# Patient Record
Sex: Female | Born: 2000 | Race: Black or African American | Hispanic: No | Marital: Single | Smoking: Never smoker
Health system: Southern US, Community
[De-identification: ages and names within clinical notes are randomized; demographics above are authoritative.]

## PROBLEM LIST (undated history)

## (undated) DIAGNOSIS — N390 Urinary tract infection, site not specified: Secondary | ICD-10-CM

## (undated) DIAGNOSIS — J45909 Unspecified asthma, uncomplicated: Secondary | ICD-10-CM

## (undated) DIAGNOSIS — F3181 Bipolar II disorder: Secondary | ICD-10-CM

## (undated) DIAGNOSIS — F32A Depression, unspecified: Secondary | ICD-10-CM

## (undated) DIAGNOSIS — F329 Major depressive disorder, single episode, unspecified: Secondary | ICD-10-CM

## (undated) DIAGNOSIS — R002 Palpitations: Secondary | ICD-10-CM

## (undated) HISTORY — PX: HERNIA REPAIR: SHX51

---
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
## 2018-02-26 ENCOUNTER — Emergency Department (HOSPITAL_COMMUNITY)
Admission: EM | Admit: 2018-02-26 | Discharge: 2018-02-26 | Disposition: A | Discharge status: Home / Self Care | Payer: Medicaid Other | Attending: Emergency Medicine | Admitting: Emergency Medicine

## 2018-02-26 ENCOUNTER — Emergency Department (HOSPITAL_COMMUNITY): Payer: Medicaid Other

## 2018-02-26 ENCOUNTER — Other Ambulatory Visit: Payer: Self-pay

## 2018-02-26 DIAGNOSIS — Z79899 Other long term (current) drug therapy: Secondary | ICD-10-CM | POA: Insufficient documentation

## 2018-02-26 DIAGNOSIS — R55 Syncope and collapse: Secondary | ICD-10-CM | POA: Diagnosis not present

## 2018-02-26 DIAGNOSIS — Y939 Activity, unspecified: Secondary | ICD-10-CM | POA: Insufficient documentation

## 2018-02-26 DIAGNOSIS — W1839XA Other fall on same level, initial encounter: Secondary | ICD-10-CM | POA: Diagnosis not present

## 2018-02-26 DIAGNOSIS — R0602 Shortness of breath: Secondary | ICD-10-CM | POA: Insufficient documentation

## 2018-02-26 DIAGNOSIS — R079 Chest pain, unspecified: Secondary | ICD-10-CM | POA: Insufficient documentation

## 2018-02-26 DIAGNOSIS — M542 Cervicalgia: Secondary | ICD-10-CM | POA: Insufficient documentation

## 2018-02-26 DIAGNOSIS — Y929 Unspecified place or not applicable: Secondary | ICD-10-CM | POA: Insufficient documentation

## 2018-02-26 DIAGNOSIS — Y999 Unspecified external cause status: Secondary | ICD-10-CM | POA: Insufficient documentation

## 2018-02-26 DIAGNOSIS — S0990XA Unspecified injury of head, initial encounter: Secondary | ICD-10-CM | POA: Diagnosis present

## 2018-02-26 DIAGNOSIS — Z3202 Encounter for pregnancy test, result negative: Secondary | ICD-10-CM | POA: Diagnosis not present

## 2018-02-26 LAB — RAPID URINE DRUG SCREEN, HOSP PERFORMED
Amphetamines: NOT DETECTED
BARBITURATES: NOT DETECTED
Benzodiazepines: NOT DETECTED
COCAINE: NOT DETECTED
Opiates: NOT DETECTED
TETRAHYDROCANNABINOL: POSITIVE — AB

## 2018-02-26 LAB — CBC WITH DIFFERENTIAL/PLATELET
Abs Immature Granulocytes: 0.01 10*3/uL (ref 0.00–0.07)
Basophils Absolute: 0 10*3/uL (ref 0.0–0.1)
Basophils Relative: 1 %
EOS ABS: 0.4 10*3/uL (ref 0.0–0.5)
EOS PCT: 7 %
HEMATOCRIT: 38.3 % (ref 36.0–46.0)
HEMOGLOBIN: 11.9 g/dL — AB (ref 12.0–15.0)
Immature Granulocytes: 0 %
LYMPHS ABS: 2.7 10*3/uL (ref 0.7–4.0)
Lymphocytes Relative: 47 %
MCH: 26.7 pg (ref 26.0–34.0)
MCHC: 31.1 g/dL (ref 30.0–36.0)
MCV: 86.1 fL (ref 80.0–100.0)
MONO ABS: 0.4 10*3/uL (ref 0.1–1.0)
Monocytes Relative: 7 %
Neutro Abs: 2.2 10*3/uL (ref 1.7–7.7)
Neutrophils Relative %: 38 %
Platelets: 267 10*3/uL (ref 150–400)
RBC: 4.45 MIL/uL (ref 3.87–5.11)
RDW: 13.9 % (ref 11.5–15.5)
WBC: 5.7 10*3/uL (ref 4.0–10.5)
nRBC: 0 % (ref 0.0–0.2)

## 2018-02-26 LAB — BASIC METABOLIC PANEL
Anion gap: 6 (ref 5–15)
BUN: 15 mg/dL (ref 6–20)
CHLORIDE: 109 mmol/L (ref 98–111)
CO2: 27 mmol/L (ref 22–32)
CREATININE: 0.84 mg/dL (ref 0.44–1.00)
Calcium: 9 mg/dL (ref 8.9–10.3)
GFR calc Af Amer: >60 mL/min (ref >60)
GFR calc non Af Amer: >60 mL/min (ref >60)
GLUCOSE: 96 mg/dL (ref 70–99)
Potassium: 3.5 mmol/L (ref 3.5–5.1)
Sodium: 142 mmol/L (ref 135–145)

## 2018-02-26 LAB — CBG MONITORING, ED: GLUCOSE-CAPILLARY: 85 mg/dL (ref 70–99)

## 2018-02-26 LAB — D-DIMER, QUANTITATIVE (NOT AT ARMC): D DIMER QUANT: 0.56 ug{FEU}/mL — AB (ref 0.00–0.50)

## 2018-02-26 LAB — POC URINE PREG, ED: Preg Test, Ur: NEGATIVE

## 2018-02-26 MED ORDER — KETOROLAC TROMETHAMINE 15 MG/ML IJ SOLN
15.0000 mg | Freq: Once | INTRAMUSCULAR | Status: AC
  Administered 2018-02-26: 15 mg via INTRAVENOUS
  Filled 2018-02-26: qty 1

## 2018-02-26 MED ORDER — SODIUM CHLORIDE 0.9 % IJ SOLN
INTRAMUSCULAR | Status: AC
  Filled 2018-02-26: qty 50

## 2018-02-26 MED ORDER — IOPAMIDOL (ISOVUE-370) INJECTION 76%
INTRAVENOUS | Status: AC
  Filled 2018-02-26: qty 100

## 2018-02-26 MED ORDER — IOPAMIDOL (ISOVUE-370) INJECTION 76%
100.0000 mL | Freq: Once | INTRAVENOUS | Status: AC | PRN
  Administered 2018-02-26: 55 mL via INTRAVENOUS

## 2018-02-26 MED ORDER — IBUPROFEN 800 MG PO TABS: 800.0000 mg | ORAL_TABLET | Freq: Three times a day (TID) | ORAL | 0 refills | Status: DC | PRN

## 2018-02-26 MED ORDER — OXYCODONE HCL 5 MG PO TABS
5.0000 mg | ORAL_TABLET | Freq: Once | ORAL | Status: AC
  Administered 2018-02-26: 5 mg via ORAL
  Filled 2018-02-26: qty 1

## 2018-02-26 NOTE — ED Triage Notes (Signed)
Patient arrives via EMS with syncopal episode at school. Patient states she thought she had an asthma attack which leads to anxiety then had a syncopal episode. +Neck pain, head pain, sensitivity to light. C-collar in place.   NSR  BP: 126/84 02: 100% Cbg: 94

## 2018-02-26 NOTE — ED Provider Notes (Signed)
[facility]-EMERGENCY DEPT Provider Note   CSN: [id number] Arrival date & time: 02/26/18  1043     History   Chief Complaint Chief Complaint  Patient presents with  . Loss of Consciousness  . Neck Pain    HPI Kara Byrd is a 17 y.o. female.  The history is provided by the patient and medical records. No language interpreter was used.  Loss of Consciousness   Associated symptoms include chest pain and headaches. Pertinent negatives include abdominal pain and palpitations.  Neck Pain   Associated symptoms include photophobia, chest pain, syncope and headaches.   Kara Byrd is a 17 y.o. female who presents to the Emergency Department for evaluation after witnessed syncopal episode. No witnesses present at bedside. Patient states that no one informed her timeframe of LOC. She did strike her head and complaining of headache and neck pain. She reports feeling as if she was going to have an anxiety attack just prior. She felt like her chest was tight and was having some shortness of breath. She is chest pain free currently and no longer experiencing shortness of breath either. She is on OCP's. No recent leg swelling or calf pain. No family hx of sudden cardiac death.    Past Medical History:  Diagnosis Date  . Depression     Patient Active Problem List   Diagnosis Date Noted  . Suicidal ideation 07/23/2017    Past Surgical History:  Procedure Laterality Date  . HERNIA REPAIR       OB History   None      Home Medications    Prior to Admission medications   Medication Sig Start Date End Date Taking? Authorizing Provider  acetaminophen (TYLENOL) 325 MG tablet Take 2 tablets (650 mg total) by mouth every 6 (six) hours as needed. 07/31/17  Yes Hedges, Tinnie Gens, PA-C  albuterol (PROVENTIL HFA;VENTOLIN HFA) 108 (90 Base) MCG/ACT inhaler Inhale 2 puffs into the lungs every 6 (six) hours as needed for shortness of breath. 12/20/15  Yes  [provider]  cetirizine (ZYRTEC) 10 MG tablet Take 10 mg by mouth daily as needed for allergies. 12/21/16 02/26/18 Yes [provider]  lamoTRIgine (LAMICTAL) 100 MG tablet Take 100 mg by mouth daily.   Yes [provider]  montelukast (SINGULAIR) 10 MG tablet Take 10 mg by mouth at bedtime as needed (allergies).   Yes [provider]  norgestimate-ethinyl estradiol (ORTHO-CYCLEN,SPRINTEC,PREVIFEM) 0.25-35 MG-MCG tablet Take 1 tablet by mouth every evening.    Yes [provider]  ibuprofen (ADVIL,MOTRIN) 800 MG tablet Take 1 tablet (800 mg total) by mouth every 8 (eight) hours as needed. 02/26/18   Ward, Chase Picket, PA-C    Family History No family history on file.  Social History Social History   Tobacco Use  . Smoking status: Never Smoker  . Smokeless tobacco: Never Used  Substance Use Topics  . Alcohol use: No    Frequency: Never  . Drug use: No     Allergies   Amoxapine and related; Amoxicillin; and Aspirin   Review of Systems Review of Systems  Eyes: Positive for photophobia. Negative for visual disturbance.  Respiratory: Positive for shortness of breath. Negative for cough.   Cardiovascular: Positive for chest pain and syncope. Negative for palpitations and leg swelling.  Gastrointestinal: Negative for abdominal pain.  Musculoskeletal: Positive for neck pain.  Neurological: Positive for headaches.     Physical Exam Updated Vital Signs BP 115/62   Pulse 74  Temp 98.4 F (36.9 C) (Oral)   Resp 17   Ht 5\' 3"  (1.6 m)   Wt 46.3 kg   LMP 02/19/2018   SpO2 100%   BMI 18.08 kg/m   Physical Exam  Constitutional: She is oriented to person, place, and time. She appears well-developed and well-nourished. No distress.  HENT:  Head: Normocephalic and atraumatic.  Neck:  C-collar in place. + midline tenderness.  Cardiovascular: Normal rate, regular rhythm and normal heart sounds.  No murmur  heard. Pulmonary/Chest: Effort normal and breath sounds normal. No respiratory distress.  Abdominal: Soft. She exhibits no distension. There is no tenderness.  Musculoskeletal:  No lower extremity swelling or calf tenderness. Diffuse tenderness to thoracic spine both paraspinally and midline. 5/5 muscle strength in upper and lower extremities bilaterally including strong and equal grip strength and dorsiflexion/plantar flexion  Neurological: She is alert and oriented to person, place, and time.  Alert, oriented, thought content appropriate, able to give a coherent history. Speech is clear and goal oriented, able to follow commands.  Cranial Nerves:  II:  Peripheral visual fields grossly normal, pupils equal, round, reactive to light III, IV, VI: EOM intact bilaterally, ptosis not present V,VII: smile symmetric, eyes kept closed tightly against resistance, facial light touch sensation equal VIII: hearing grossly normal IX, X: symmetric soft palate movement, uvula elevates symmetrically  XI: bilateral shoulder shrug symmetric and strong XII: midline tongue extension Sensory to light touch normal in all four extremities.  Normal finger-to-nose and rapid alternating movements; No drift.   Skin: Skin is warm and dry.  Nursing note and vitals reviewed.    ED Treatments / Results  Labs (all labs ordered are listed, but only abnormal results are displayed) Labs Reviewed  CBC WITH DIFFERENTIAL/PLATELET - Abnormal; Notable for the following components:      Result Value   Hemoglobin 11.9 (*)    All other components within normal limits  RAPID URINE DRUG SCREEN, HOSP PERFORMED - Abnormal; Notable for the following components:   Tetrahydrocannabinol POSITIVE (*)    All other components within normal limits  D-DIMER, QUANTITATIVE (NOT AT [facility]) - Abnormal; Notable for the following components:   D-Dimer, Quant 0.56 (*)    All other components within normal limits  BASIC METABOLIC PANEL  CBG  MONITORING, ED  POC URINE PREG, ED    EKG EKG Interpretation  Date/Time:  Wednesday February 26 2018 11:01:39 EDT Ventricular Rate:  82 PR Interval:    QRS Duration: 97 QT Interval:  338 QTC Calculation: 395 R Axis:   81 Text Interpretation:  Sinus rhythm Confirmed by Kennis Carina [phone]) on 02/26/2018 12:08:05 PM   Radiology Dg Thoracic Spine 2 View  Result Date: 02/26/2018 CLINICAL DATA:  17 year old female status post syncopal episode at school. Upper back pain. EXAM: THORACIC SPINE 2 VIEWS COMPARISON:  Cervical spine CT today reported separately. FINDINGS: Normal thoracic segmentation. Nearing skeletal maturity. Bone mineralization is within normal limits. Cervicothoracic junction alignment is within normal limits. Slightly less reversal of cervical lordosis than demonstrated earlier today. Normal thoracic vertebral height and alignment. Relatively preserved disc spaces. Visible posterior ribs, thoracic and upper abdominal visceral contours appear normal. IMPRESSION: Negative. Electronically Signed   By: Odessa Fleming M.D.   On: 02/26/2018 12:59   Ct Head Wo Contrast  Result Date: 02/26/2018 CLINICAL DATA:  Posttraumatic headache and neck pain after fall. EXAM: CT HEAD WITHOUT CONTRAST CT CERVICAL SPINE WITHOUT CONTRAST TECHNIQUE: Multidetector CT imaging of the head and cervical spine was  performed following the standard protocol without intravenous contrast. Multiplanar CT image reconstructions of the cervical spine were also generated. COMPARISON:  None. FINDINGS: CT HEAD FINDINGS Brain: No evidence of acute infarction, hemorrhage, hydrocephalus, extra-axial collection or mass lesion/mass effect. Vascular: No hyperdense vessel or unexpected calcification. Skull: Normal. Negative for fracture or focal lesion. Sinuses/Orbits: Mild right sphenoid sinusitis is noted. Other: None. CT CERVICAL SPINE FINDINGS Alignment: Normal. Skull base and vertebrae: No acute fracture. No primary bone lesion  or focal pathologic process. Soft tissues and spinal canal: No prevertebral fluid or swelling. No visible canal hematoma. Disc levels:  Normal. Upper chest: Negative. Other: None. IMPRESSION: Mild right sphenoid sinusitis. No acute intracranial abnormality seen. Normal cervical spine. Electronically Signed   By: Lupita Raider, M.D.   On: 02/26/2018 12:51   Ct Angio Chest Pe W And/or Wo Contrast  Result Date: 02/26/2018 CLINICAL DATA:  Syncopal episode at school. Concern for asthma attack. Evaluate for pulmonary embolism. EXAM: CT ANGIOGRAPHY CHEST WITH CONTRAST TECHNIQUE: Multidetector CT imaging of the chest was performed using the standard protocol during bolus administration of intravenous contrast. Multiplanar CT image reconstructions and MIPs were obtained to evaluate the vascular anatomy. CONTRAST:  55 mL ISOVUE-370 IOPAMIDOL (ISOVUE-370) INJECTION 76% COMPARISON:  Head and C-spine CT - earlier same day FINDINGS: Vascular Findings: There is adequate opacification of the pulmonary arterial system with the main pulmonary artery measuring 344 Hounsfield units. There are no discrete filling defects within the pulmonary arterial tree to suggest pulmonary embolism. Normal caliber of the main pulmonary artery. Normal heart size.  No pericardial effusion. No evidence of thoracic aortic aneurysm, dissection or periaortic stranding on this nongated examination. Suspected bovine configuration of the aortic arch, suboptimally evaluated due to streak artifact from contrast within the adjacent left subclavian vein. Review of the MIP images confirms the above findings. ---------------------------------------------------------------------------------- Nonvascular Findings: Mediastinum/Lymph Nodes: No bulky mediastinal, hilar axillary lymphadenopathy. Lungs/Pleura: No focal airspace opacities. No pleural effusion or pneumothorax. The central pulmonary airways appear widely patent. No discrete pulmonary nodules. Upper  abdomen: Limited early arterial phase evaluation of the upper abdomen is normal. Musculoskeletal: No acute or aggressive osseous abnormalities. Regional soft tissues appear normal. Normal appearance of the thyroid gland. IMPRESSION: No acute cardiopulmonary disease. Specifically, no evidence of pulmonary embolism. Electronically Signed   By: Simonne Come M.D.   On: 02/26/2018 14:05   Ct Cervical Spine Wo Contrast  Result Date: 02/26/2018 CLINICAL DATA:  Posttraumatic headache and neck pain after fall. EXAM: CT HEAD WITHOUT CONTRAST CT CERVICAL SPINE WITHOUT CONTRAST TECHNIQUE: Multidetector CT imaging of the head and cervical spine was performed following the standard protocol without intravenous contrast. Multiplanar CT image reconstructions of the cervical spine were also generated. COMPARISON:  None. FINDINGS: CT HEAD FINDINGS Brain: No evidence of acute infarction, hemorrhage, hydrocephalus, extra-axial collection or mass lesion/mass effect. Vascular: No hyperdense vessel or unexpected calcification. Skull: Normal. Negative for fracture or focal lesion. Sinuses/Orbits: Mild right sphenoid sinusitis is noted. Other: None. CT CERVICAL SPINE FINDINGS Alignment: Normal. Skull base and vertebrae: No acute fracture. No primary bone lesion or focal pathologic process. Soft tissues and spinal canal: No prevertebral fluid or swelling. No visible canal hematoma. Disc levels:  Normal. Upper chest: Negative. Other: None. IMPRESSION: Mild right sphenoid sinusitis. No acute intracranial abnormality seen. Normal cervical spine. Electronically Signed   By: Lupita Raider, M.D.   On: 02/26/2018 12:51    Procedures Procedures (including critical care time)  Medications Ordered in ED Medications  sodium chloride 0.9 % injection (has no administration in time range)  iopamidol (ISOVUE-370) 76 % injection (has no administration in time range)  oxyCODONE (Oxy IR/ROXICODONE) immediate release tablet 5 mg (5 mg Oral  Given 02/26/18 1201)  iopamidol (ISOVUE-370) 76 % injection 100 mL (55 mLs Intravenous Contrast Given 02/26/18 1340)  ketorolac (TORADOL) 15 MG/ML injection 15 mg (15 mg Intravenous Given 02/26/18 1426)     Initial Impression / Assessment and Plan / ED Course  I have reviewed the triage vital signs and the nursing notes.  Pertinent labs & imaging results that were available during my care of the patient were reviewed by me and considered in my medical decision making (see chart for details).    Kara Byrd is a 17 y.o. female who presents to ED for syncopal episode just prior to arrival. Does have hx of similar. Did have chest pain just prior similar to her typical anxiety attacks. EKG NSR. Given chest pain with syncope, d-dimer was obtained. This was slightly elevated, therefore proceeded to CT angio which was negative. Upreg negative. Labs reassuring. Did strike her head and complaining of headache / neck pain. CT head and c-spine negative. Patient observed in ED. Tolerating PO. Ambulating in ED without difficulty. Evaluation does not show pathology that would require ongoing emergent intervention or inpatient treatment. Encouraged to follow up with PCP. Return precautions discussed. All questions answered.   Patient seen by and discussed with Dr. Pilar Plate who agrees with treatment plan.    Final Clinical Impressions(s) / ED Diagnoses   Final diagnoses:  Syncope and collapse  Injury of head, initial encounter    ED Discharge Orders         Ordered    ibuprofen (ADVIL,MOTRIN) 800 MG tablet  Every 8 hours PRN     02/26/18 1436           Ward, Chase Picket, PA-C 02/26/18 1514    Sabas Sous, MD 02/26/18 1600

## 2018-02-26 NOTE — Discharge Instructions (Addendum)
It was my pleasure taking care of you today!   Ibuprofen as needed for pain. Limiting screen time (tv's, phone, tablet, etc.) and staying in a darker room can help with headache as well.   If your symptoms are not improving in 1 week, please see your campus health clinic or schedule a follow up appointment with the clinic listed.   Return to ER for new or worsening symptoms, any additional concerns.

## 2018-02-26 NOTE — ED Notes (Signed)
Patient verbalized understanding of discharge instructions, no questions. Patient ambulated out of ED with steady gait in no distress.  

## 2018-02-28 ENCOUNTER — Other Ambulatory Visit: Payer: Self-pay

## 2018-02-28 ENCOUNTER — Encounter (HOSPITAL_COMMUNITY): Payer: Self-pay | Admitting: *Deleted

## 2018-02-28 ENCOUNTER — Emergency Department (HOSPITAL_COMMUNITY)
Admission: EM | Admit: 2018-02-28 | Discharge: 2018-02-28 | Disposition: A | Discharge status: Home / Self Care | Payer: Medicaid Other | Attending: Emergency Medicine | Admitting: Emergency Medicine

## 2018-02-28 DIAGNOSIS — F329 Major depressive disorder, single episode, unspecified: Secondary | ICD-10-CM | POA: Insufficient documentation

## 2018-02-28 DIAGNOSIS — Z79899 Other long term (current) drug therapy: Secondary | ICD-10-CM | POA: Insufficient documentation

## 2018-02-28 DIAGNOSIS — N939 Abnormal uterine and vaginal bleeding, unspecified: Secondary | ICD-10-CM | POA: Diagnosis present

## 2018-02-28 LAB — I-STAT BETA HCG BLOOD, ED (MC, WL, AP ONLY): I-stat hCG, quantitative: <5 m[IU]/mL (ref <5)

## 2018-02-28 LAB — URINALYSIS, ROUTINE W REFLEX MICROSCOPIC
Bilirubin Urine: NEGATIVE
Glucose, UA: NEGATIVE mg/dL
Hgb urine dipstick: NEGATIVE
KETONES UR: NEGATIVE mg/dL
LEUKOCYTES UA: NEGATIVE
NITRITE: NEGATIVE
PH: 8 (ref 5.0–8.0)
PROTEIN: NEGATIVE mg/dL
Specific Gravity, Urine: 1.021 (ref 1.005–1.030)

## 2018-02-28 LAB — CBC
HEMATOCRIT: 37.8 % (ref 36.0–46.0)
Hemoglobin: 11.6 g/dL — ABNORMAL LOW (ref 12.0–15.0)
MCH: 26.8 pg (ref 26.0–34.0)
MCHC: 30.7 g/dL (ref 30.0–36.0)
MCV: 87.3 fL (ref 80.0–100.0)
NRBC: 0 % (ref 0.0–0.2)
PLATELETS: 258 10*3/uL (ref 150–400)
RBC: 4.33 MIL/uL (ref 3.87–5.11)
RDW: 14 % (ref 11.5–15.5)
WBC: 5.1 10*3/uL (ref 4.0–10.5)

## 2018-02-28 LAB — WET PREP, GENITAL
Clue Cells Wet Prep HPF POC: NONE SEEN
SPERM: NONE SEEN
Trich, Wet Prep: NONE SEEN
YEAST WET PREP: NONE SEEN

## 2018-02-28 NOTE — ED Provider Notes (Signed)
[facility]-EMERGENCY DEPT Provider Note  CSN: [id number] Arrival date & time: 02/28/18  1949    History   Chief Complaint Chief Complaint  Patient presents with  . Vaginal Bleeding    HPI Kara Byrd is a 17 y.o. female with no significant medical historywho presented to the ED for   Vaginal Bleeding  Primary symptoms include vaginal bleeding.  Primary symptoms include no discharge, no pelvic pain, no dyspareunia, no genital lesions, no genital pain, no genital rash, no genital itching, no genital odor, no dysuria. There has been no fever. Episode onset: 2 months ago. Episode frequency: Most days. She occassionally has 2-3 days without bleeding. The problem has not changed since onset.She is not pregnant. She has not missed her period. LMP: 02/19/18. The patient's menstrual history has been regular. The discharge was bloody. Associated symptoms include abdominal pain (occasional cramping) and nausea. Pertinent negatives include no anorexia, no diaphoresis, no constipation, no diarrhea, no vomiting, no light-headedness and no dizziness. She has tried nothing for the symptoms. The treatment provided no relief. Sexual activity: sexually active. There is no concern regarding sexually transmitted diseases. She uses oral contraceptives for contraception. Associated medical issues do not include STD, PID, endometriosis, gynecological surgery or ectopic pregnancy.    Past Medical History:  Diagnosis Date  . Depression     Patient Active Problem List   Diagnosis Date Noted  . Suicidal ideation 07/23/2017    Past Surgical History:  Procedure Laterality Date  . HERNIA REPAIR       OB History   None      Home Medications    Prior to Admission medications   Medication Sig Start Date End Date Taking? Authorizing Provider  albuterol (PROVENTIL HFA;VENTOLIN HFA) 108 (90 Base) MCG/ACT inhaler Inhale 2 puffs into the lungs every 6 (six) hours as needed for  shortness of breath. 12/20/15  Yes [provider]  ibuprofen (ADVIL,MOTRIN) 800 MG tablet Take 1 tablet (800 mg total) by mouth every 8 (eight) hours as needed. Patient taking differently: Take 800 mg by mouth every 8 (eight) hours as needed for moderate pain.  02/26/18  Yes Ward, Chase Picket, PA-C  lamoTRIgine (LAMICTAL) 100 MG tablet Take 100 mg by mouth every evening.    Yes [provider]  norgestimate-ethinyl estradiol (ORTHO-CYCLEN,SPRINTEC,PREVIFEM) 0.25-35 MG-MCG tablet Take 1 tablet by mouth every evening.    Yes [provider]  acetaminophen (TYLENOL) 325 MG tablet Take 2 tablets (650 mg total) by mouth every 6 (six) hours as needed. Patient not taking: Reported on 02/28/2018 07/31/17   Eyvonne Mechanic, PA-C    Family History No family history on file.  Social History Social History   Tobacco Use  . Smoking status: Never Smoker  . Smokeless tobacco: Never Used  Substance Use Topics  . Alcohol use: No    Frequency: Never  . Drug use: No     Allergies   Amoxapine and related; Amoxicillin; and Aspirin   Review of Systems Review of Systems  Constitutional: Negative for diaphoresis.  Gastrointestinal: Positive for abdominal pain (occasional cramping) and nausea. Negative for anorexia, constipation, diarrhea and vomiting.  Genitourinary: Positive for vaginal bleeding. Negative for difficulty urinating, dyspareunia, dysuria, pelvic pain, urgency, vaginal discharge and vaginal pain.  Musculoskeletal: Negative.   Skin: Negative.   Neurological: Negative for dizziness and light-headedness.  Hematological: Does not bruise/bleed easily.   Physical Exam Updated Vital Signs BP 109/72 (BP Location: Left Arm)   Pulse 78   Temp  98.4 F (36.9 C) (Oral)   Resp 18   LMP 02/19/2018   SpO2 100%   Physical Exam  Constitutional: Vital signs are normal. She appears well-developed and well-nourished. She is cooperative. No distress.  Cardiovascular:  Normal rate, regular rhythm and normal heart sounds.  Pulmonary/Chest: Effort normal and breath sounds normal.  Abdominal: Soft. Normal appearance and bowel sounds are normal. There is no tenderness.  Genitourinary: Uterus normal. Cervix exhibits no motion tenderness, no discharge and no friability. There is bleeding in the vagina. No vaginal discharge found.  Genitourinary Comments: Cervical os closed. Dark red blood seen in vagina.  Musculoskeletal: Normal range of motion.  Lymphadenopathy: No inguinal adenopathy noted on the right or left side.  Neurological: She is alert.  Nursing note and vitals reviewed.  ED Treatments / Results  Labs (all labs ordered are listed, but only abnormal results are displayed) Labs Reviewed  WET PREP, GENITAL - Abnormal; Notable for the following components:      Result Value   WBC, Wet Prep HPF POC FEW (*)    All other components within normal limits  CBC - Abnormal; Notable for the following components:   Hemoglobin 11.6 (*)    All other components within normal limits  URINALYSIS, ROUTINE W REFLEX MICROSCOPIC - Abnormal; Notable for the following components:   APPearance HAZY (*)    All other components within normal limits  I-STAT BETA HCG BLOOD, ED ([facility], [facility], [facility] ONLY)  GC/CHLAMYDIA PROBE AMP ([place]) NOT AT [facility]    EKG None  Radiology No results found.  Procedures Procedures (including critical care time)  Medications Ordered in ED Medications - No data to display   Initial Impression / Assessment and Plan / ED Course  Triage vital signs and the nursing notes have been reviewed.  Pertinent labs & imaging results that were available during care of the patient were reviewed and considered in medical decision making (see chart for details).  Patient presents to the ED afebrile with complaints of vaginal bleeding x2 months. She has no other significant accompanying symptoms. On pelvic exam, vaginal bleeding is present, but there was  no abnormal findings such as palpable masses on bimanual exam. Given family history significant for menorrhagia and pt's demographics, it is possible that fibroids is a viable etiology. She is afebrile, well appearing and does not have any s/s to suggest an infectious etiology that would warrant further evaluation today. Will order hCG to evaluate for possible ectopic and CBC to look for anemia that warrants intervention.  Clinical Course as of Feb 29 2248  Fri Feb 28, 2018  2227 Negative urine hCG which assists in ruling out ectopic pregnancy. UA and wet prep unremarkable. Hgb consistent with baseline.   [GM]    Clinical Course User Index [GM] Mortis, Sharyon Medicus, PA-C    Final Clinical Impressions(s) / ED Diagnoses  1. Vaginal Bleeding. Advised to continue birth control as prescribed. Referral to gyn for follow-up.  Dispo: Home. After thorough clinical evaluation, this patient is determined to be medically stable and can be safely discharged with the previously mentioned treatment and/or outpatient follow-up/referral(s). At this time, there are no other apparent medical conditions that require further screening, evaluation or treatment.   Final diagnoses:  Vaginal bleeding    ED Discharge Orders    None        Reva Bores 02/28/18 2253    Tegeler, Canary Brim, MD 03/01/18 0002

## 2018-02-28 NOTE — ED Triage Notes (Signed)
Pt says for 2-3 months she has been having heavy vaginal bleeding. She usually has a 2-3 day time where she does not bleed and then it returns heavy with clotting. She is on oral contraceptives.

## 2018-02-28 NOTE — Discharge Instructions (Signed)
Your lab work looked good today.   I have placed contact information for a gynecology office for you to call and follow-up with. Please call them on Monday to schedule an appointment.  Thank you for allowing [place] to take care of you today.

## 2018-03-03 LAB — GC/CHLAMYDIA PROBE AMP (~~LOC~~) NOT AT ARMC
Chlamydia: POSITIVE — AB
NEISSERIA GONORRHEA: NEGATIVE

## 2018-03-07 ENCOUNTER — Telehealth: Payer: Self-pay | Admitting: Student

## 2018-03-07 DIAGNOSIS — A749 Chlamydial infection, unspecified: Secondary | ICD-10-CM

## 2018-03-07 MED ORDER — AZITHROMYCIN 500 MG PO TABS: 1000.0000 mg | ORAL_TABLET | Freq: Once | ORAL | 0 refills | Status: AC

## 2018-03-07 NOTE — Telephone Encounter (Addendum)
Kara Byrd tested positive for  Chlamydia. Patient was called by RN and allergies and pharmacy confirmed. Rx sent to pharmacy of choice.   Judeth Horn, NP 03/07/2018 3:39 PM       ----- Message from Kathe Becton, RN sent at 03/07/2018  2:21 PM EDT ----- This patient tested positive for :  chlamydia  She is allergic to:" Amoxapine, Amoxicillin, Aspirin."   I have informed the patient of her results and confirmed her pharmacy is correct in her chart. Please send Rx.   Thank you,   Kathe Becton, RN   Results faxed to [facility] Department.

## 2018-03-12 ENCOUNTER — Emergency Department (HOSPITAL_COMMUNITY): Payer: Medicaid Other

## 2018-03-12 ENCOUNTER — Encounter (HOSPITAL_COMMUNITY): Payer: Self-pay | Admitting: *Deleted

## 2018-03-12 ENCOUNTER — Emergency Department (HOSPITAL_COMMUNITY)
Admission: EM | Admit: 2018-03-12 | Discharge: 2018-03-12 | Disposition: A | Discharge status: Home / Self Care | Payer: Medicaid Other | Attending: Emergency Medicine | Admitting: Emergency Medicine

## 2018-03-12 DIAGNOSIS — R55 Syncope and collapse: Secondary | ICD-10-CM

## 2018-03-12 DIAGNOSIS — Z79899 Other long term (current) drug therapy: Secondary | ICD-10-CM | POA: Insufficient documentation

## 2018-03-12 DIAGNOSIS — J069 Acute upper respiratory infection, unspecified: Secondary | ICD-10-CM | POA: Diagnosis not present

## 2018-03-12 LAB — I-STAT CG4 LACTIC ACID, ED: Lactic Acid, Venous: 1.72 mmol/L (ref 0.5–1.9)

## 2018-03-12 LAB — URINALYSIS, ROUTINE W REFLEX MICROSCOPIC
Bilirubin Urine: NEGATIVE
GLUCOSE, UA: NEGATIVE mg/dL
HGB URINE DIPSTICK: NEGATIVE
Ketones, ur: NEGATIVE mg/dL
NITRITE: NEGATIVE
PH: 8 (ref 5.0–8.0)
PROTEIN: NEGATIVE mg/dL
SPECIFIC GRAVITY, URINE: 1.006 (ref 1.005–1.030)

## 2018-03-12 LAB — BASIC METABOLIC PANEL
Anion gap: 4 — ABNORMAL LOW (ref 5–15)
BUN: 6 mg/dL (ref 6–20)
CALCIUM: 8.6 mg/dL — AB (ref 8.9–10.3)
CO2: 26 mmol/L (ref 22–32)
Chloride: 109 mmol/L (ref 98–111)
Creatinine, Ser: 0.72 mg/dL (ref 0.44–1.00)
GFR calc Af Amer: >60 mL/min (ref >60)
GLUCOSE: 94 mg/dL (ref 70–99)
Potassium: 3.7 mmol/L (ref 3.5–5.1)
SODIUM: 139 mmol/L (ref 135–145)

## 2018-03-12 LAB — CBC
HCT: 36 % (ref 36.0–46.0)
Hemoglobin: 11.2 g/dL — ABNORMAL LOW (ref 12.0–15.0)
MCH: 26.6 pg (ref 26.0–34.0)
MCHC: 31.1 g/dL (ref 30.0–36.0)
MCV: 85.5 fL (ref 80.0–100.0)
PLATELETS: 244 10*3/uL (ref 150–400)
RBC: 4.21 MIL/uL (ref 3.87–5.11)
RDW: 13.4 % (ref 11.5–15.5)
WBC: 5.3 10*3/uL (ref 4.0–10.5)
nRBC: 0 % (ref 0.0–0.2)

## 2018-03-12 LAB — CBG MONITORING, ED: Glucose-Capillary: 70 mg/dL (ref 70–99)

## 2018-03-12 LAB — GROUP A STREP BY PCR: GROUP A STREP BY PCR: NOT DETECTED

## 2018-03-12 MED ORDER — SODIUM CHLORIDE 0.9 % IV BOLUS
1000.0000 mL | Freq: Once | INTRAVENOUS | Status: AC
  Administered 2018-03-12: 1000 mL via INTRAVENOUS

## 2018-03-12 NOTE — ED Provider Notes (Signed)
[facility] EMERGENCY DEPARTMENT Provider Note   CSN: [id number] Arrival date & time: 03/12/18  1333     History   Chief Complaint Chief Complaint  Patient presents with  . Near Syncope    HPI Kara Byrd is a 17 y.o. female.  The history is provided by the patient and medical records. No language interpreter was used.   Kara Byrd is a 17 y.o. female who presents to the Emergency Department complaining of syncopal episode just prior to arrival. Patient states that she was at school when she felt like she was going to pass out. She asked a classmate to help her get on the floor to lay against the wall. She was worried about sitting in the desk chair as they are spinning and she thought she may fall off. Classmate started helping her to the ground when she had a syncopal episode.  She states that her classmate told her that she passed out for a few seconds.  She believes she had full syncopal episode as she passed out as she kept "coming and going" in and out of consciousness.  She denies any head injury or other injury from the fall.  Denies any shaking or seizure-like activity.  No bowel or bladder incontinence.  Associated with dizziness which she describes as feeling like she spun around really quickly and you stop but still feel dizzy.  She has been experiencing URI symptoms since yesterday morning including cough, congestion and sore throat.  Denies chest pain or shortness of breath.  No fever or chills.  No abdominal pain, nausea or vomiting.  She was seen in the emergency department for similar episode on 10/16 where she did hit her head.  She had a CT of her head done at that time which was unremarkable.  She also had CT angios to assess for PE as she was having chest pain at that time.   Past Medical History:  Diagnosis Date  . Depression     Patient Active Problem List   Diagnosis Date Noted  . Suicidal ideation 07/23/2017    Past  Surgical History:  Procedure Laterality Date  . HERNIA REPAIR       OB History   None      Home Medications    Prior to Admission medications   Medication Sig Start Date End Date Taking? Authorizing Provider  albuterol (PROVENTIL HFA;VENTOLIN HFA) 108 (90 Base) MCG/ACT inhaler Inhale 2 puffs into the lungs every 6 (six) hours as needed for shortness of breath. 12/20/15  Yes [provider]  cetirizine (ZYRTEC) 10 MG tablet Take 10 mg by mouth as needed for allergies.   Yes [provider]  ibuprofen (ADVIL,MOTRIN) 800 MG tablet Take 1 tablet (800 mg total) by mouth every 8 (eight) hours as needed. Patient taking differently: Take 800 mg by mouth every 6 (six) hours as needed for moderate pain.  02/26/18  Yes Nakeya Adinolfi, Chase Picket, PA-C  lamoTRIgine (LAMICTAL) 100 MG tablet Take 100 mg by mouth every evening.    Yes [provider]  montelukast (SINGULAIR) 10 MG tablet Take 10 mg by mouth as needed.   Yes [provider]  norgestimate-ethinyl estradiol (ORTHO-CYCLEN,SPRINTEC,PREVIFEM) 0.25-35 MG-MCG tablet Take 1 tablet by mouth every evening.    Yes [provider]  acetaminophen (TYLENOL) 325 MG tablet Take 2 tablets (650 mg total) by mouth every 6 (six) hours as needed. Patient not taking: Reported on 02/28/2018 07/31/17   Eyvonne Mechanic, PA-C  Family History History reviewed. No pertinent family history.  Social History Social History   Tobacco Use  . Smoking status: Never Smoker  . Smokeless tobacco: Never Used  Substance Use Topics  . Alcohol use: No    Frequency: Never  . Drug use: No     Allergies   Amoxapine and related; Amoxicillin; and Aspirin   Review of Systems Review of Systems  Constitutional: Negative for fever.  HENT: Positive for congestion and sore throat.   Respiratory: Positive for cough. Negative for shortness of breath.   Cardiovascular: Negative for chest pain.  Neurological: Positive for syncope.    All other systems reviewed and are negative.    Physical Exam Updated Vital Signs BP 119/83 (BP Location: Right Arm)   Pulse 76   Temp 98 F (36.7 C) (Oral)   Resp 20   LMP 02/19/2018   SpO2 100%   Physical Exam  Constitutional: She is oriented to person, place, and time. She appears well-developed and well-nourished. No distress.  HENT:  Head: Normocephalic and atraumatic.  OP with mild erythema. No tonsillar exudates or hypertrophy.   Cardiovascular: Normal rate, regular rhythm and normal heart sounds.  No murmur heard. Pulmonary/Chest: Effort normal and breath sounds normal. No respiratory distress.  Lungs clear to auscultation bilaterally.  Abdominal: Soft. She exhibits no distension. There is no tenderness.  Musculoskeletal: She exhibits no edema.  Neurological: She is alert and oriented to person, place, and time.  Alert, oriented, thought content appropriate, able to give a coherent history. Speech is clear and goal oriented, able to follow commands.  Cranial Nerves:  II:  Peripheral visual fields grossly normal, pupils equal, round, reactive to light III, IV, VI: EOM intact bilaterally, ptosis not present V,VII: smile symmetric, eyes kept closed tightly against resistance, facial light touch sensation equal VIII: hearing grossly normal IX, X: symmetric soft palate movement, uvula elevates symmetrically  XI: bilateral shoulder shrug symmetric and strong XII: midline tongue extension 5/5 muscle strength in upper and lower extremities bilaterally including strong and equal grip strength and dorsiflexion/plantar flexion Sensory to light touch normal in all four extremities.  Normal finger-to-nose and rapid alternating movements;  normal gait and balance.  Skin: Skin is warm and dry.  Nursing note and vitals reviewed.    ED Treatments / Results  Labs (all labs ordered are listed, but only abnormal results are displayed) Labs Reviewed  BASIC METABOLIC PANEL -  Abnormal; Notable for the following components:      Result Value   Calcium 8.6 (*)    Anion gap 4 (*)    All other components within normal limits  CBC - Abnormal; Notable for the following components:   Hemoglobin 11.2 (*)    All other components within normal limits  URINALYSIS, ROUTINE W REFLEX MICROSCOPIC - Abnormal; Notable for the following components:   APPearance TURBID (*)    Leukocytes, UA SMALL (*)    Bacteria, UA MANY (*)    All other components within normal limits  GROUP A STREP BY PCR  CBG MONITORING, ED  I-STAT BETA HCG BLOOD, ED ([facility], [facility], [facility] ONLY)  I-STAT CG4 LACTIC ACID, ED    EKG EKG Interpretation  Date/Time:  Wednesday March 12 2018 13:39:37 EDT Ventricular Rate:  85 PR Interval:  152 QRS Duration: 66 QT Interval:  346 QTC Calculation: 411 R Axis:   87 Text Interpretation:  Normal sinus rhythm Normal ECG When compared to prior, no significant changes seen.  No STEMI Confirmed  by Theda Belfast ([id number]) on 03/12/2018 3:25:50 PM   Radiology Dg Chest 2 View  Result Date: 03/12/2018 CLINICAL DATA:  17 year old female with syncope today at school. Shortness of breath with chest pain and productive cough. EXAM: CHEST - 2 VIEW COMPARISON:  CTA chest and thoracic radiographs 02/26/2018 FINDINGS: Upright AP and lateral views. Lung volumes and mediastinal contours are normal. Visualized tracheal air column is within normal limits. Both lungs appear clear. No pneumothorax or pleural effusion. No osseous abnormality identified. Negative visible bowel gas pattern. IMPRESSION: Negative. No cardiopulmonary abnormality. Electronically Signed   By: Odessa Fleming M.D.   On: 03/12/2018 14:52    Procedures Procedures (including critical care time)  Medications Ordered in ED Medications  sodium chloride 0.9 % bolus 1,000 mL (1,000 mLs Intravenous New Bag/Given 03/12/18 1559)     Initial Impression / Assessment and Plan / ED Course  I have reviewed the triage vital signs  and the nursing notes.  Pertinent labs & imaging results that were available during my care of the patient were reviewed by me and considered in my medical decision making (see chart for details).     Kara Byrd is a 17 y.o. female who presents to ED for evaluation after syncopal episode just prior to arrival.  Seen on 10/16 by me for same.  At that time, she did undergo a CT scan of her head as well as CT Angio to r/o PE. Both of these showed no acute abnormalities. EKG at prior visit and today reassuring. Labs and orthostatics fine as well. Evaluation does not show pathology that would require ongoing emergent intervention or inpatient treatment. Given recurrent syncope, will refer to cardiology for Holter monitoring. Reasons to return to ER discussed and all questions answered.   Patient discussed with Dr. Rush Landmark who agrees with treatment plan.     Final Clinical Impressions(s) / ED Diagnoses   Final diagnoses:  Viral URI  Syncope and collapse    ED Discharge Orders    None       Kara Byrd, Chase Picket, PA-C 03/12/18 1725    Tegeler, Canary Brim, MD 03/13/18 [id number]

## 2018-03-12 NOTE — ED Triage Notes (Addendum)
Pt in via EMS to triage from school after syncopal episode in class, was feeling dizzy, assisted to the ground by a classmate, alert and oriented, feeling weak on arrival with a headache, pt states she has not been feeling well for the last few days, took some niquil last night which is not normal for her, had another syncopal episode a few weeks ago and was seen at that time, IV in place on arrival, pt reports she was told she had a concussion after last syncopal episode

## 2018-03-12 NOTE — ED Provider Notes (Signed)
Patient placed in Quick Look pathway, seen and evaluated   Chief Complaint: Syncope  HPI:   Pt passed out.  She was caught by a friend and helped to floor, no injury.  Pt reports recent  cough  ROS: cough no fever   Physical Exam:   Gen: No distress  Neuro: Awake and Alert  Skin: Warm    Focused Exam: Normal resp. Normal heart rate  Chest xray ordered  Initiation of care has begun. The patient has been counseled on the process, plan, and necessity for staying for the completion/evaluation, and the remainder of the medical screening examination   Osie Cheeks 03/12/18 1352    Gerhard Munch, MD 03/12/18 1517

## 2018-03-12 NOTE — Discharge Instructions (Signed)
It was my pleasure taking care of you today!   Please call the cardiology clinic listed first thing in the morning to schedule a follow-up appointment.  Drink plenty of fluids to stay well-hydrated. ` Return to the ER for new or worsening symptoms, any additional concerns.

## 2018-04-03 ENCOUNTER — Encounter: Payer: Self-pay | Admitting: Internal Medicine

## 2018-04-03 ENCOUNTER — Ambulatory Visit (INDEPENDENT_AMBULATORY_CARE_PROVIDER_SITE_OTHER): Payer: Medicaid Other | Admitting: Internal Medicine

## 2018-04-03 VITALS — BP 106/64 | HR 95 | Ht 63.0 in | Wt 108.2 lb

## 2018-04-03 DIAGNOSIS — R002 Palpitations: Secondary | ICD-10-CM

## 2018-04-03 DIAGNOSIS — R55 Syncope and collapse: Secondary | ICD-10-CM | POA: Diagnosis not present

## 2018-04-03 NOTE — Progress Notes (Signed)
Cardiology Office Note:    Date:  04/03/2018   ID:  Kara Byrd, DOB 07/02/2000, MRN [id number]  PCP:  System, Pcp Not In  Cardiologist:  No primary care provider on file.  Electrophysiologist:  None   Referring MD: Tegeler, Canary Brim,*   Syncope  History of Present Illness:    Kara Byrd is a 17 y.o. female with a hx of depression, who presents today for evaluation of a syncopal episode and palpitations.  She has had 4-5 episodes of lifetime syncope, the last 2 occurring in August and October 2019.  2 nights ago she had an unremarkable day, and at night when she was putting her clothes away she had a sensation of heart racing and presyncope.  She had to lie down on the floor to resolve her discomfort.  This is a similar sensation to what she experienced prior to her syncopal episode in October for which she presented to the emergency department.  She has accompanying chest discomfort and dizziness and lightheadedness.  While she is walking around or exercising she will have a sense of shortness of breath and chest discomfort.  She has asthma and takes Singulair and albuterol.  Occasionally she will take albuterol when she has a sensation of chest tightness and shortness of breath, but it does not always relieve her symptoms.  She has no other chronic medical illnesses.  She exercises at the gym and also walks around campus.  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  She will notice sometimes while she is sitting in class she will get a sensation of discomfort in her chest with palpitations and a rapid heartbeat.  She will have dizziness and some presyncope.  She denies any known history of heart disease and no known major illnesses in childhood.  She believes her mother had hypertension during her pregnancy with her.  The pregnancy was otherwise uncomplicated.  She has a  family history of a great uncle who died as an infant, and her paternal great grandmother who had sudden death.  Her maternal great-grandmother had an MI in her 51s.  There is no known family history of hypertrophic cardiomyopathy or arrhythmia.  She finds that her symptoms are primarily exacerbated by stress and anxiety, but she can have symptoms while she is having an ordinary or pleasant day.  She denies PND, orthopnea, or leg swelling.  She does snore.  She denies smoking, and denies alcohol use.  She denies recreational substance use, diet supplements, or herbal supplements.  Of note her urine drug screen performed in mid October was positive for THC.  She takes Lamictal for mood disorder, and she feels that this causes her to not sleep.  She is working on this with her psychiatry providers.  Past Medical History:  Diagnosis Date  . Depression     Past Surgical History:  Procedure Laterality Date  . HERNIA REPAIR      Current Medications: Current Meds  Medication Sig  . acetaminophen (TYLENOL) 325 MG tablet Take 2 tablets (650 mg total) by mouth every 6 (six) hours as needed.  Marland Kitchen albuterol (PROVENTIL HFA;VENTOLIN HFA) 108 (90 Base) MCG/ACT inhaler Inhale 2 puffs into the lungs every 6 (six) hours as needed for shortness of breath.  . cetirizine (ZYRTEC) 10 MG tablet Take 10 mg by mouth as needed for allergies.  Marland Kitchen ibuprofen (ADVIL,MOTRIN) 800 MG tablet Take 1 tablet (800  mg total) by mouth every 8 (eight) hours as needed. (Patient taking differently: Take 800 mg by mouth every 6 (six) hours as needed for moderate pain. )  . lamoTRIgine (LAMICTAL) 100 MG tablet Take 100 mg by mouth every evening.   . montelukast (SINGULAIR) 10 MG tablet Take 10 mg by mouth as needed.  . norgestimate-ethinyl estradiol (ORTHO-CYCLEN,SPRINTEC,PREVIFEM) 0.25-35 MG-MCG tablet Take 1 tablet by mouth every evening.      Allergies:   Amoxapine and related; Amoxicillin; and Aspirin   Social History    Socioeconomic History  . Marital status: Single    Spouse name: Not on file  . Number of children: Not on file  . Years of education: Not on file  . Highest education level: Not on file  Occupational History  . Not on file  Social Needs  . Financial resource strain: Not on file  . Food insecurity:    Worry: Not on file    Inability: Not on file  . Transportation needs:    Medical: Not on file    Non-medical: Not on file  Tobacco Use  . Smoking status: Never Smoker  . Smokeless tobacco: Never Used  Substance and Sexual Activity  . Alcohol use: No    Frequency: Never  . Drug use: No  . Sexual activity: Not on file  Lifestyle  . Physical activity:    Days per week: Not on file    Minutes per session: Not on file  . Stress: Not on file  Relationships  . Social connections:    Talks on phone: Not on file    Gets together: Not on file    Attends religious service: Not on file    Active member of club or organization: Not on file    Attends meetings of clubs or organizations: Not on file    Relationship status: Not on file  Other Topics Concern  . Not on file  Social History Narrative  . Not on file     Family History: The patient's family history includes Asthma in her mother.  ROS:   Please see the history of present illness.    All other systems reviewed and are negative.  EKGs/Labs/Other Studies Reviewed:    The following studies were reviewed today:  EKG:  EKG is ordered today.  The ekg ordered today demonstrates NSR, 95 bpm.  Recent Labs: 07/23/2017: ALT 15 03/12/2018: BUN 6; Creatinine, Ser 0.72; Hemoglobin 11.2; Platelets 244; Potassium 3.7; Sodium 139  Recent Lipid Panel No results found for: CHOL, TRIG, HDL, CHOLHDL, VLDL, LDLCALC, LDLDIRECT  Physical Exam:    VS:  BP 106/64   Pulse 95   Ht 5\' 3"  (1.6 m)   Wt 108 lb 3.2 oz (49.1 kg)   BMI 19.17 kg/m     Wt Readings from Last 3 Encounters:  04/03/18 108 lb 3.2 oz (49.1 kg) (13 %, Z= -1.10)*   02/26/18 102 lb 1.2 oz (46.3 kg) (6 %, Z= -1.58)*  07/23/17 102 lb (46.3 kg) (6 %, Z= -1.52)*   * Growth percentiles are based on CDC (Girls, 2-20 Years) data.     Constitutional: No acute distress Eyes: pupils equally round and reactive to light, sclera non-icteric, normal conjunctiva and lids ENMT: normal dentition, moist mucous membranes Cardiovascular: regular rhythm, tachycardic, no murmurs. S1 and S2 normal. Radial pulses normal bilaterally. No jugular venous distention.  Respiratory: clear to auscultation bilaterally GI : normal bowel sounds, soft and nontender. No distention.   MSK: extremities  warm, well perfused. No edema.  NEURO: grossly nonfocal exam, moves all extremities. PSYCH: alert and oriented x 3, normal mood and affect.  SKIN: warm and dry  ASSESSMENT:    1. Palpitations   2. Syncope, unspecified syncope type    PLAN:    She has had 4-5 episodes of lifetime syncope.  While these episodes sound like they may have a neurocardiogenic component, it is concerning that she is having sustained episodes of tachycardia with presyncope and true syncope.  We will evaluate for structural cardiovascular issues with an echocardiogram, and given that her symptoms are not predictable or daily, we will obtain a 30-day event monitor.  If her symptoms represents supraventricular tachycardia I have provided her with information on vagal maneuvers and have instructed her on how to perform these.  I have emphasized not driving, and not participating in physical activity on days where she feels unwell.  She follows with a primary care doctor at home in [place], [place].  I do not have access to her recent laboratory testing.  We will check a hemoglobin A1c to screen for diabetes, and will check a TSH for palpitations.  I will see her back in 3 months time to reassess symptoms and any recurrent episodes.  Medication Adjustments/Labs and Tests Ordered: Current medicines are  reviewed at length with the patient today.  Concerns regarding medicines are outlined above.  Orders Placed This Encounter  Procedures  . TSH  . HgB A1c  . Cardiac event monitor  . EKG 12-Lead  . ECHOCARDIOGRAM COMPLETE   No orders of the defined types were placed in this encounter.   Patient Instructions  Medication Instructions:  Your physician recommends that you continue on your current medications as directed. Please refer to the Current Medication list given to you today.  If you need a refill on your cardiac medications before your next appointment, please call your pharmacy.   Lab work: Tsh and HgA1c today If you have labs (blood work) drawn today and your tests are completely normal, you will receive your results only by: Marland Kitchen MyChart Message (if you have MyChart) OR . A paper copy in the mail If you have any lab test that is abnormal or we need to change your treatment, we will call you to review the results.  Testing/Procedures: Your physician has recommended that you wear an event monitor. Event monitors are medical devices that record the heart's electrical activity. Doctors most often [place] these monitors to diagnose arrhythmias. Arrhythmias are problems with the speed or rhythm of the heartbeat. The monitor is a small, portable device. You can wear one while you do your normal daily activities. This is usually used to diagnose what is causing palpitations/syncope (passing out).  Your physician has requested that you have an echocardiogram. Echocardiography is a painless test that uses sound waves to create images of your heart. It provides your doctor with information about the size and shape of your heart and how well your heart's chambers and valves are working. This procedure takes approximately one hour. There are no restrictions for this procedure.    Follow-Up: At [facility], you and your health needs are our priority.  As part of our continuing mission to provide  you with exceptional heart care, we have created designated Provider Care Teams.  These Care Teams include your primary Cardiologist (physician) and Advanced Practice Providers (APPs -  Physician Assistants and Nurse Practitioners) who all work together to provide you with the care  you need, when you need it. You will need a follow up appointment in 3 months.  Please call our office 2 months in advance to schedule this appointment.  You may see Dr.Ahnesty Finfrock or one of the following Advanced Practice Providers on your designated Care Team:   Theodore Demark, PA-C . Joni Reining, DNP, ANP  Any Other Special Instructions Will Be Listed Below (If Applicable). Vagal maneuvers if needed. Please refer to the hand our you were given      Signed, Parke Poisson, MD  04/03/2018 4:33 PM    [facility]

## 2018-04-03 NOTE — Patient Instructions (Signed)
Medication Instructions:  Your physician recommends that you continue on your current medications as directed. Please refer to the Current Medication list given to you today.  If you need a refill on your cardiac medications before your next appointment, please call your pharmacy.   Lab work: Tsh and HgA1c today If you have labs (blood work) drawn today and your tests are completely normal, you will receive your results only by: Marland Kitchen. MyChart Message (if you have MyChart) OR . A paper copy in the mail If you have any lab test that is abnormal or we need to change your treatment, we will call you to review the results.  Testing/Procedures: Your physician has recommended that you wear an event monitor. Event monitors are medical devices that record the heart's electrical activity. Doctors most often us these monitors to diagnose arrhythmias. Arrhythmias are problems with the speed or rhythm of the heartbeat. The monitor is a small, portable device. You can wear one while you do your normal daily activities. This is usually used to diagnose what is causing palpitations/syncope (passing out).  Your physician has requested that you have an echocardiogram. Echocardiography is a painless test that uses sound waves to create images of your heart. It provides your doctor with information about the size and shape of your heart and how well your heart's chambers and valves are working. This procedure takes approximately one hour. There are no restrictions for this procedure.    Follow-Up: At [facility], you and your health needs are our priority.  As part of our continuing mission to provide you with exceptional heart care, we have created designated Provider Care Teams.  These Care Teams include your primary Cardiologist (physician) and Advanced Practice Providers (APPs -  Physician Assistants and Nurse Practitioners) who all work together to provide you with the care you need, when you need it. You will  need a follow up appointment in 3 months.  Please call our office 2 months in advance to schedule this appointment.  You may see Dr.Acharya or one of the following Advanced Practice Providers on your designated Care Team:   Theodore DemarkRhonda Barrett, PA-C . Joni ReiningKathryn Lawrence, DNP, ANP  Any Other Special Instructions Will Be Listed Below (If Applicable). Vagal maneuvers if needed. Please refer to the hand our you were given

## 2018-04-04 LAB — TSH: TSH: 0.845 u[IU]/mL (ref 0.450–4.500)

## 2018-04-04 LAB — HEMOGLOBIN A1C
Est. average glucose Bld gHb Est-mCnc: 105 mg/dL
Hgb A1c MFr Bld: 5.3 % (ref 4.8–5.6)

## 2018-04-07 ENCOUNTER — Telehealth: Payer: Self-pay

## 2018-04-07 NOTE — Telephone Encounter (Signed)
error 

## 2018-04-20 ENCOUNTER — Emergency Department (HOSPITAL_COMMUNITY)
Admission: EM | Admit: 2018-04-20 | Discharge: 2018-04-20 | Disposition: A | Discharge status: Home / Self Care | Payer: Medicaid Other | Attending: Emergency Medicine | Admitting: Emergency Medicine

## 2018-04-20 ENCOUNTER — Other Ambulatory Visit: Payer: Self-pay

## 2018-04-20 ENCOUNTER — Encounter (HOSPITAL_COMMUNITY): Payer: Self-pay | Admitting: Emergency Medicine

## 2018-04-20 DIAGNOSIS — Z79899 Other long term (current) drug therapy: Secondary | ICD-10-CM | POA: Diagnosis not present

## 2018-04-20 DIAGNOSIS — R55 Syncope and collapse: Secondary | ICD-10-CM | POA: Diagnosis present

## 2018-04-20 DIAGNOSIS — F319 Bipolar disorder, unspecified: Secondary | ICD-10-CM | POA: Insufficient documentation

## 2018-04-20 DIAGNOSIS — R45851 Suicidal ideations: Secondary | ICD-10-CM | POA: Insufficient documentation

## 2018-04-20 DIAGNOSIS — J45909 Unspecified asthma, uncomplicated: Secondary | ICD-10-CM | POA: Insufficient documentation

## 2018-04-20 HISTORY — DX: Unspecified asthma, uncomplicated: J45.909

## 2018-04-20 HISTORY — DX: Bipolar II disorder: F31.81

## 2018-04-20 HISTORY — DX: Palpitations: R00.2

## 2018-04-20 LAB — I-STAT BETA HCG BLOOD, ED (MC, WL, AP ONLY): I-stat hCG, quantitative: <5 m[IU]/mL (ref <5)

## 2018-04-20 LAB — CBC
HEMATOCRIT: 34.8 % — AB (ref 36.0–46.0)
Hemoglobin: 10.7 g/dL — ABNORMAL LOW (ref 12.0–15.0)
MCH: 26.7 pg (ref 26.0–34.0)
MCHC: 30.7 g/dL (ref 30.0–36.0)
MCV: 86.8 fL (ref 80.0–100.0)
Platelets: 253 10*3/uL (ref 150–400)
RBC: 4.01 MIL/uL (ref 3.87–5.11)
RDW: 13.4 % (ref 11.5–15.5)
WBC: 6 10*3/uL (ref 4.0–10.5)
nRBC: 0 % (ref 0.0–0.2)

## 2018-04-20 LAB — BASIC METABOLIC PANEL
Anion gap: 8 (ref 5–15)
BUN: 14 mg/dL (ref 6–20)
CHLORIDE: 108 mmol/L (ref 98–111)
CO2: 23 mmol/L (ref 22–32)
Calcium: 8.5 mg/dL — ABNORMAL LOW (ref 8.9–10.3)
Creatinine, Ser: 0.73 mg/dL (ref 0.44–1.00)
GFR calc Af Amer: >60 mL/min (ref >60)
GFR calc non Af Amer: >60 mL/min (ref >60)
Glucose, Bld: 83 mg/dL (ref 70–99)
Potassium: 3.6 mmol/L (ref 3.5–5.1)
SODIUM: 139 mmol/L (ref 135–145)

## 2018-04-20 NOTE — Discharge Instructions (Addendum)
If your mood worsens follow-up with your counselor or return to the ER.  Follow-up with cardiology as planned.

## 2018-04-20 NOTE — ED Provider Notes (Signed)
[facility]-EMERGENC[facility] DEPT Provider Note   CSN: [id number] Arrival date & time: 04/20/18  2002     History   Chief Complaint Chief Complaint  Patient presents with  . Loss of Consciousness  . Suicidal    HPI Kara Byrd is a 17 y.o. female.  HPI Patient presents with syncope.  States she is been having episodes she feels her heart race and she passes out.  Has been seen by cardiology and is planned for monitoring and echocardiogram tomorrow.  May be an anxiety component with it.  States she has been more anxious recently.  Also history of bipolar disorder.  Reportedly her roommate said she had been saying she wanted to overdose on pills.  Patient states she just took her Lamictal and her medicine to help her sleep.  States she has not slept well last couple days.  States she does "want to die".  States this is however chronic for her.  Is nothing new.  History of bipolar and states this is the way she always is.  Does not want to attempt suicide.  Has counselors and psychiatrist that she sees. Past Medical History:  Diagnosis Date  . Asthma   . Bipolar 2 disorder (HCC)   . Depression   . Heart palpitations     Patient Active Problem List   Diagnosis Date Noted  . Suicidal ideation 07/23/2017    Past Surgical History:  Procedure Laterality Date  . HERNIA REPAIR       OB History   None      Home Medications    Prior to Admission medications   Medication Sig Start Date End Date Taking? Authorizing Provider  albuterol (PROVENTIL HFA;VENTOLIN HFA) 108 (90 Base) MCG/ACT inhaler Inhale 2 puffs into the lungs every 6 (six) hours as needed for shortness of breath. 12/20/15  Yes [provider]  cetirizine (ZYRTEC) 10 MG tablet Take 10 mg by mouth as needed for allergies.   Yes [provider]  lamoTRIgine (LAMICTAL) 100 MG tablet Take 100 mg by mouth every evening.    Yes [provider]  montelukast (SINGULAIR) 10 MG  tablet Take 10 mg by mouth at bedtime as needed (allergies).    Yes [provider]  norgestimate-ethinyl estradiol (ORTHO-CYCLEN,SPRINTEC,PREVIFEM) 0.25-35 MG-MCG tablet Take 1 tablet by mouth every evening.    Yes [provider]  acetaminophen (TYLENOL) 325 MG tablet Take 2 tablets (650 mg total) by mouth every 6 (six) hours as needed. Patient not taking: Reported on 04/20/2018 07/31/17   Hedges, Tinnie Gens, PA-C  ibuprofen (ADVIL,MOTRIN) 800 MG tablet Take 1 tablet (800 mg total) by mouth every 8 (eight) hours as needed. Patient not taking: Reported on 04/20/2018 02/26/18   Ward, Chase Picket, PA-C    Family History Family History  Problem Relation Age of Onset  . Asthma Mother     Social History Social History   Tobacco Use  . Smoking status: Never Smoker  . Smokeless tobacco: Never Used  Substance Use Topics  . Alcohol use: No    Frequency: Never  . Drug use: No     Allergies   Amoxapine and related; Amoxicillin; and Aspirin   Review of Systems Review of Systems  Constitutional: Negative for appetite change.  HENT: Negative for congestion.   Respiratory: Negative for shortness of breath.   Cardiovascular: Positive for palpitations.  Gastrointestinal: Negative for abdominal pain.  Genitourinary: Negative for dysuria.  Musculoskeletal: Negative for back pain.  Skin: Negative for  rash.  Neurological: Positive for syncope.  Hematological: Does not bruise/bleed easily.  Psychiatric/Behavioral: Positive for dysphoric mood. Negative for confusion.     Physical Exam Updated Vital Signs BP 108/74   Pulse 82   Temp 98.1 F (36.7 C) (Oral)   Resp 14   LMP 04/13/2018 (Approximate)   SpO2 100%   Physical Exam  Constitutional: She appears well-developed.  HENT:  Head: Normocephalic.  Eyes: Pupils are equal, round, and reactive to light.  Neck: Neck supple.  Cardiovascular: Normal rate.  Pulmonary/Chest: Effort normal.  Abdominal: There is no  tenderness.  Musculoskeletal: She exhibits no edema.  Neurological: She is alert.  Skin: Skin is warm. Capillary refill takes less than 2 seconds.  Psychiatric: She has a normal mood and affect.     ED Treatments / Results  Labs (all labs ordered are listed, but only abnormal results are displayed) Labs Reviewed  BASIC METABOLIC PANEL - Abnormal; Notable for the following components:      Result Value   Calcium 8.5 (*)    All other components within normal limits  CBC - Abnormal; Notable for the following components:   Hemoglobin 10.7 (*)    HCT 34.8 (*)    All other components within normal limits  I-STAT BETA HCG BLOOD, ED ([facility], [facility], [facility] ONLY)    EKG EKG Interpretation  Date/Time:  Sunday April 20 2018 20:23:58 EST Ventricular Rate:  90 PR Interval:    QRS Duration: 69 QT Interval:  337 QTC Calculation: 413 R Axis:   77 Text Interpretation:  Sinus rhythm Confirmed by Benjiman CorePickering, [place] [phone] on 04/20/2018 9:42:06 PM   Radiology No results found.  Procedures Procedures (including critical care time)  Medications Ordered in ED Medications - No data to display   Initial Impression / Assessment and Plan / ED Course  I have reviewed the triage vital signs and the nursing notes.  Pertinent labs & imaging results that were available during my care of the patient were reviewed by me and considered in my medical decision making (see chart for details).     Patient has palpitations.  History of same.  Has follow-up tomorrow.  I think safe for discharge and then aspect.  There was also some discussion about her mood.  Patient states she did not make statements that she was going to overdose.  States she just took her normal pills.  States she does "want to die".  States this is her chronic state.  Reviewing records she has had episodes of this in the past and this does appear to be somewhat chronic.  States she does not want to attempt anything and feels safe at home.   Will discharge.  Final Clinical Impressions(s) / ED Diagnoses   Final diagnoses:  Syncope, unspecified syncope type    ED Discharge Orders    None       Benjiman CorePickering, Jaymes Revels, MD 04/20/18 [id number]

## 2018-04-20 NOTE — ED Triage Notes (Addendum)
Per EMS, patient from [employer], friends report patient was telling her friends she wanted to OD on pills. Reports taking Lamictal and hydrocodone as prescribed today. States "I want to die." Hx depression and bipolar.   During time of triage, patient denies SI/HI/A/V/H. Patient states "I never said any of that, I came to be seen for my chest." Reports hx heart palpitations. C/o syncopal episode after getting out of bed at approximately 1800 today. Denies N/V/D.

## 2018-04-20 NOTE — ED Notes (Signed)
Bed: [facility] Expected date:  Expected time:  Means of arrival:  Comments: 

## 2018-04-21 ENCOUNTER — Ambulatory Visit (HOSPITAL_COMMUNITY): Payer: Medicaid Other | Attending: Cardiology

## 2018-04-21 ENCOUNTER — Other Ambulatory Visit: Payer: Self-pay | Admitting: Internal Medicine

## 2018-04-21 ENCOUNTER — Ambulatory Visit (INDEPENDENT_AMBULATORY_CARE_PROVIDER_SITE_OTHER): Payer: Medicaid Other

## 2018-04-21 ENCOUNTER — Other Ambulatory Visit: Payer: Self-pay

## 2018-04-21 DIAGNOSIS — R55 Syncope and collapse: Secondary | ICD-10-CM

## 2018-04-21 DIAGNOSIS — R002 Palpitations: Secondary | ICD-10-CM

## 2018-04-21 DIAGNOSIS — R42 Dizziness and giddiness: Secondary | ICD-10-CM | POA: Diagnosis not present

## 2018-04-22 ENCOUNTER — Telehealth: Payer: Self-pay

## 2018-04-22 NOTE — Telephone Encounter (Signed)
-----   Message from Parke PoissonGayatri A Acharya, MD sent at 04/22/2018  8:10 AM EST ----- Normal echo

## 2018-04-22 NOTE — Telephone Encounter (Signed)
Attempted to contact pt regarding echo results. Pt phone is disconnected. No DPR on file.

## 2018-05-28 ENCOUNTER — Telehealth: Payer: Self-pay

## 2018-05-28 NOTE — Telephone Encounter (Signed)
Phone number listed fro pt is d/c. Unsuccessful Attempts have previously been made to reach the pt by phone. A letter was mailed in Dec 2019 to the address listed for the pt for her to contact the office for test results. Will mail 2nd letter.

## 2018-05-28 NOTE — Telephone Encounter (Signed)
-----   Message from Parke Poisson, MD sent at 05/27/2018  5:34 PM EST ----- No worrisome findings, but we will talk further at our next appt about possible symptomatic treatment.

## 2018-06-04 ENCOUNTER — Telehealth: Payer: Self-pay | Admitting: Internal Medicine

## 2018-06-04 NOTE — Telephone Encounter (Signed)
Follow Up:     Calling you back, you were unable to reach her by phone.

## 2018-06-04 NOTE — Telephone Encounter (Signed)
Pt contacted the office after receiving a letter that we were trying to reach her. Pt aware of her echo and cardiac monitor results. Adv pt to keep her upcoming appt with Dr.Acharya scheduled for 07/02/18 @3pm  to discuss a plan of care. Confirmed date and time of appt with pt. Pt has no questions or concerns at this time and will f/u with Dr.A as planned.

## 2018-06-04 NOTE — Telephone Encounter (Signed)
Returned pt call. Unable to lmom pt voicemail is not set up. 

## 2018-06-05 ENCOUNTER — Other Ambulatory Visit: Payer: Self-pay

## 2018-06-05 ENCOUNTER — Ambulatory Visit (HOSPITAL_COMMUNITY)
Admission: EM | Admit: 2018-06-05 | Discharge: 2018-06-05 | Disposition: A | Discharge status: Home / Self Care | Payer: Medicaid Other | Attending: Internal Medicine | Admitting: Internal Medicine

## 2018-06-05 ENCOUNTER — Encounter (HOSPITAL_COMMUNITY): Payer: Self-pay

## 2018-06-05 DIAGNOSIS — J029 Acute pharyngitis, unspecified: Secondary | ICD-10-CM | POA: Diagnosis present

## 2018-06-05 LAB — POCT RAPID STREP A: Streptococcus, Group A Screen (Direct): NEGATIVE

## 2018-06-05 MED ORDER — GUAIFENESIN 100 MG/5ML PO LIQD: 100.0000 mg | ORAL | 0 refills | Status: DC | PRN

## 2018-06-05 NOTE — ED Provider Notes (Signed)
[facility]    CSN: [id number] Arrival date & time: 06/05/18  1242     History   Chief Complaint Chief Complaint  Patient presents with  . Sore Throat    HPI Kara Byrd is a 18 y.o. female with a history of asthma comes to the urgent care department for 1 day history of sore throat, cough.  Sore throat started yesterday and is currently of moderate severity.  No aggravating factors.  Patient has not tried any remedy/medications for relief.  No associated nausea or vomiting.  No shortness of breath or sputum production.  No wheezing.  HPI  Past Medical History:  Diagnosis Date  . Asthma   . Bipolar 2 disorder (HCC)   . Depression   . Heart palpitations     Patient Active Problem List   Diagnosis Date Noted  . Suicidal ideation 07/23/2017    Past Surgical History:  Procedure Laterality Date  . HERNIA REPAIR      OB History   No obstetric history on file.      Home Medications    Prior to Admission medications   Medication Sig Start Date End Date Taking? Authorizing Provider  albuterol (PROVENTIL HFA;VENTOLIN HFA) 108 (90 Base) MCG/ACT inhaler Inhale 2 puffs into the lungs every 6 (six) hours as needed for shortness of breath. 12/20/15   [provider]  cetirizine (ZYRTEC) 10 MG tablet Take 10 mg by mouth as needed for allergies.    [provider]  guaiFENesin (ROBITUSSIN) 100 MG/5ML liquid Take 5-10 mLs (100-200 mg total) by mouth every 4 (four) hours as needed for cough. 06/05/18   Merrilee Jansky, MD  lamoTRIgine (LAMICTAL) 100 MG tablet Take 100 mg by mouth every evening.     [provider]  montelukast (SINGULAIR) 10 MG tablet Take 10 mg by mouth at bedtime as needed (allergies).     [provider]  norgestimate-ethinyl estradiol (ORTHO-CYCLEN,SPRINTEC,PREVIFEM) 0.25-35 MG-MCG tablet Take 1 tablet by mouth every evening.     [provider]    Family History Family History  Problem  Relation Age of Onset  . Asthma Mother     Social History Social History   Tobacco Use  . Smoking status: Never Smoker  . Smokeless tobacco: Never Used  Substance Use Topics  . Alcohol use: No    Frequency: Never  . Drug use: No     Allergies   Amoxapine and related; Amoxicillin; and Aspirin   Review of Systems Review of Systems  Constitutional: Negative for activity change, appetite change and chills.  HENT: Positive for congestion and ear pain. Negative for drooling and ear discharge.   Eyes: Negative for pain, discharge and itching.  Respiratory: Negative for chest tightness and wheezing.   Cardiovascular: Negative for chest pain.  Gastrointestinal: Negative for abdominal pain and nausea.  Musculoskeletal: Negative for arthralgias, back pain and gait problem.  Neurological: Negative for dizziness, light-headedness and numbness.  Hematological: Negative for adenopathy. Does not bruise/bleed easily.  Psychiatric/Behavioral: Negative for agitation, confusion and hallucinations.     Physical Exam Triage Vital Signs ED Triage Vitals  Enc Vitals Group     BP 06/05/18 1447 113/63     Pulse Rate 06/05/18 1447 91     Resp 06/05/18 1447 16     Temp 06/05/18 1447 98.7 F (37.1 C)     Temp Source 06/05/18 1447 Oral     SpO2 06/05/18 1447 100 %     Weight 06/05/18 1445  108 lb (49 kg)     Height --      Head Circumference --      Peak Flow --      Pain Score 06/05/18 1445 8     Pain Loc --      Pain Edu? --      Excl. in GC? --    No data found.  Updated Vital Signs BP 113/63 (BP Location: Right Arm)   Pulse 91   Temp 98.7 F (37.1 C) (Oral)   Resp 16   Wt 49 kg   LMP 05/23/2018   SpO2 100%   BMI 19.13 kg/m   Visual Acuity Right Eye Distance:   Left Eye Distance:   Bilateral Distance:    Right Eye Near:   Left Eye Near:    Bilateral Near:     Physical Exam Constitutional:      Appearance: She is well-developed.  HENT:     Right Ear: A middle ear  effusion is present.     Left Ear: Tympanic membrane normal.     Nose: Congestion and rhinorrhea present.     Mouth/Throat:     Mouth: Mucous membranes are moist.     Pharynx: No posterior oropharyngeal erythema or uvula swelling.     Tonsils: Tonsillar exudate present. Swelling: 2+ on the right. 2+ on the left.  Eyes:     Conjunctiva/sclera: Conjunctivae normal.     Pupils: Pupils are equal, round, and reactive to light.  Neck:     Musculoskeletal: Normal range of motion.  Pulmonary:     Effort: Pulmonary effort is normal.  Abdominal:     Palpations: Abdomen is soft.  Neurological:     Mental Status: She is alert.      UC Treatments / Results  Labs (all labs ordered are listed, but only abnormal results are displayed) Labs Reviewed  CULTURE, GROUP A STREP [facility])  POCT RAPID STREP A    EKG None  Radiology No results found.  Procedures Procedures (including critical care time)  Medications Ordered in UC Medications - No data to display  Initial Impression / Assessment and Plan / UC Course  I have reviewed the triage vital signs and the nursing notes.  Pertinent labs & imaging results that were available during my care of the patient were reviewed by me and considered in my medical decision making (see chart for details).     1.  Acute viral pharyngotonsillitis: Symptomatic/supportive care Optimize oral fluid intake Tylenol/NSAID for pain Bronchodilator treatments for shortness of breath/wheezing Robitussin for congestion.  2.  Asthma without exacerbation: Continue bronchodilator treatments Final Clinical Impressions(s) / UC Diagnoses   Final diagnoses:  Viral pharyngitis   Discharge Instructions   None    ED Prescriptions    Medication Sig Dispense Auth. Provider   guaiFENesin (ROBITUSSIN) 100 MG/5ML liquid Take 5-10 mLs (100-200 mg total) by mouth every 4 (four) hours as needed for cough. 60 mL Deyonna Fitzsimmons, Britta Mccreedy, MD     Controlled Substance  Prescriptions [place] Controlled Substance Registry consulted? No   Merrilee Jansky, MD 06/05/18 1753

## 2018-06-05 NOTE — ED Triage Notes (Signed)
Pt cc sore throat x 3 days 

## 2018-06-08 LAB — CULTURE, GROUP A STREP (THRC)

## 2018-06-09 ENCOUNTER — Telehealth (HOSPITAL_COMMUNITY): Payer: Self-pay | Admitting: Emergency Medicine

## 2018-06-09 NOTE — Telephone Encounter (Signed)
Culture is positive for non group A Strep germ.  This is a finding of uncertain significance; not the typical 'strep throat' germ.  Attempted call no answer and no voicemail set up

## 2018-07-02 ENCOUNTER — Ambulatory Visit: Payer: Medicaid Other | Admitting: Internal Medicine

## 2018-07-07 ENCOUNTER — Encounter: Payer: Self-pay | Admitting: Internal Medicine

## 2018-07-07 ENCOUNTER — Ambulatory Visit (INDEPENDENT_AMBULATORY_CARE_PROVIDER_SITE_OTHER): Payer: Medicaid Other | Admitting: Internal Medicine

## 2018-07-07 VITALS — BP 110/76 | HR 74 | Ht 63.0 in | Wt 111.4 lb

## 2018-07-07 DIAGNOSIS — R002 Palpitations: Secondary | ICD-10-CM

## 2018-07-07 DIAGNOSIS — R55 Syncope and collapse: Secondary | ICD-10-CM | POA: Diagnosis not present

## 2018-07-07 NOTE — Patient Instructions (Signed)
Medication Instructions:  Your physician recommends that you continue on your current medications as directed. Please refer to the Current Medication list given to you today.  If you need a refill on your cardiac medications before your next appointment, please call your pharmacy.   Lab work: None  ordered  Testing/Procedures: None ordered  Follow-Up: At [facility], you and your health needs are our priority.  As part of our continuing mission to provide you with exceptional heart care, we have created designated Provider Care Teams.  These Care Teams include your primary Cardiologist (physician) and Advanced Practice Providers (APPs -  Physician Assistants and Nurse Practitioners) who all work together to provide you with the care you need, when you need it. You will need a follow up appointment as needed   Any Other Special Instructions Will Be Listed Below (If Applicable). -Please schedule a follow up with your primary care physician to discuss a referral to neurology  -Increase sodium in your diet  -Purchase and wear compression stockings daily

## 2018-07-07 NOTE — Progress Notes (Signed)
Cardiology Office Note:    Date:  07/07/2018   ID:  Kara Byrd, DOB 2000/11/04, MRN [id number]  PCP:  Toribio Harbour, PA-C  Cardiologist:  No primary care provider on file.  Electrophysiologist:  None   Referring MD: Franco Collet*   Review echo and monitor; syncope and palpitations  History of Present Illness:    Kara Byrd is a 18 y.o. female with a hx of asthma, bipolar disorder, depression, who presents today for follow-up of echo and cardiac event monitor.  She has a friend in the room with her for the visit today.  Her echocardiogram was unremarkable and her event monitor showed sinus rhythm and sinus tachycardia in the setting of symptoms of chest pain, shortness of breath, and dizziness.  She tells me she is only had 2-3 more episodes of syncope since our last visit.  I informed her that I am still concerned about her syncope, however there does not appear to be a clear cardiac etiology at this time.  She has been performing maneuvers to counter tachycardic episodes including carotid massage and she feels that this helps, but does not always prevent syncope.  She has a driver's license but does not drive.  We discussed driving restrictions today.  The patient denies chest pain, chest pressure, dyspnea at rest or with exertion, palpitations, PND, orthopnea, or leg swelling. Denies dizziness or lightheadedness. Denies snoring and has not be evaluated for sleep apnea. Past Medical History:  Diagnosis Date  . Asthma   . Bipolar 2 disorder (HCC)   . Depression   . Heart palpitations     Past Surgical History:  Procedure Laterality Date  . HERNIA REPAIR      Current Medications: Current Meds  Medication Sig  . albuterol (PROVENTIL HFA;VENTOLIN HFA) 108 (90 Base) MCG/ACT inhaler Inhale 2 puffs into the lungs every 6 (six) hours as needed for shortness of breath.  . cetirizine (ZYRTEC) 10 MG tablet Take 10 mg by mouth as needed  for allergies.  . drospirenone-ethinyl estradiol (YAZ,GIANVI,LORYNA) 3-0.02 MG tablet Take by mouth.  Marland Kitchen guaiFENesin (ROBITUSSIN) 100 MG/5ML liquid Take 5-10 mLs (100-200 mg total) by mouth every 4 (four) hours as needed for cough.  . lamoTRIgine (LAMICTAL) 100 MG tablet Take 100 mg by mouth every evening.   . montelukast (SINGULAIR) 10 MG tablet Take 10 mg by mouth at bedtime as needed (allergies).   . norgestimate-ethinyl estradiol (ORTHO-CYCLEN,SPRINTEC,PREVIFEM) 0.25-35 MG-MCG tablet Take 1 tablet by mouth every evening.      Allergies:   Amoxapine and related; Amoxicillin; and Aspirin   Social History   Socioeconomic History  . Marital status: Single    Spouse name: Not on file  . Number of children: Not on file  . Years of education: Not on file  . Highest education level: Not on file  Occupational History  . Not on file  Social Needs  . Financial resource strain: Not on file  . Food insecurity:    Worry: Not on file    Inability: Not on file  . Transportation needs:    Medical: Not on file    Non-medical: Not on file  Tobacco Use  . Smoking status: Never Smoker  . Smokeless tobacco: Never Used  Substance and Sexual Activity  . Alcohol use: No    Frequency: Never  . Drug use: No  . Sexual activity: Not on file  Lifestyle  . Physical activity:    Days per week: Not on file  Minutes per session: Not on file  . Stress: Not on file  Relationships  . Social connections:    Talks on phone: Not on file    Gets together: Not on file    Attends religious service: Not on file    Active member of club or organization: Not on file    Attends meetings of clubs or organizations: Not on file    Relationship status: Not on file  Other Topics Concern  . Not on file  Social History Narrative  . Not on file     Family History: The patient's family history includes Asthma in her mother.  ROS:   Please see the history of present illness.    All other systems reviewed and  are negative.  EKGs/Labs/Other Studies Reviewed:    The following studies were reviewed today:  EKG: Not performed today  Recent Labs: 07/23/2017: ALT 15 04/03/2018: TSH 0.845 04/20/2018: BUN 14; Creatinine, Ser 0.73; Hemoglobin 10.7; Platelets 253; Potassium 3.6; Sodium 139  Recent Lipid Panel No results found for: CHOL, TRIG, HDL, CHOLHDL, VLDL, LDLCALC, LDLDIRECT  Physical Exam:    VS:  BP 110/76   Pulse 74   Ht 5\' 3"  (1.6 m)   Wt 111 lb 6.4 oz (50.5 kg)   SpO2 99%   BMI 19.73 kg/m     Wt Readings from Last 3 Encounters:  07/07/18 111 lb 6.4 oz (50.5 kg) (18 %, Z= -0.91)*  06/05/18 108 lb (49 kg) (13 %, Z= -1.14)*  04/03/18 108 lb 3.2 oz (49.1 kg) (13 %, Z= -1.10)*   * Growth percentiles are based on CDC (Girls, 2-20 Years) data.     Constitutional: No acute distress Eyes: pupils equally round and reactive to light, sclera non-icteric, normal conjunctiva and lids ENMT: normal dentition, moist mucous membranes Cardiovascular: regular rhythm, normal rate, no murmurs. S1 and S2 normal. Radial pulses normal bilaterally. No jugular venous distention.  Respiratory: clear to auscultation bilaterally GI : normal bowel sounds, soft and nontender. No distention.   MSK: extremities warm, well perfused. No edema.  NEURO: grossly nonfocal exam, moves all extremities. PSYCH: alert and oriented x 3, normal mood and affect.   ASSESSMENT:    1. Syncope, unspecified syncope type   2. Palpitations    PLAN:    We have discussed conservative measures today including compression stockings, and liberalizing salt intake.  We also continue to emphasize maneuvers to prevent tachycardia and syncope, and she will continue to attempt these.  She should seek a referral to neurology from her primary care provider, I believe we have ruled out cardiovascular causes for her syncope.  Her syncope is not positional in nature suggesting that it is not related to a postural orthostatic tachycardia  syndrome, however if no worrisome findings are found on a neurology evaluation, we can certainly pursue further work-up for this.  We have discussed inappropriate sinus tachycardia today, however she and I participated in shared decision making and do not feel that medication therapy is warranted at this time.  Will be very important to rule out noncardiac causes of syncope and I have discussed this with the patient and her friend in the room today.  I would like for the patient to follow-up as needed after she has had an opportunity to follow-up with neurology as I am still concerned about her syncope.   Medication Adjustments/Labs and Tests Ordered: Current medicines are reviewed at length with the patient today.  Concerns regarding medicines are outlined above.  No orders of the defined types were placed in this encounter.  No orders of the defined types were placed in this encounter.   Patient Instructions  Medication Instructions:  Your physician recommends that you continue on your current medications as directed. Please refer to the Current Medication list given to you today.  If you need a refill on your cardiac medications before your next appointment, please call your pharmacy.   Lab work: None  ordered  Testing/Procedures: None ordered  Follow-Up: At [facility], you and your health needs are our priority.  As part of our continuing mission to provide you with exceptional heart care, we have created designated Provider Care Teams.  These Care Teams include your primary Cardiologist (physician) and Advanced Practice Providers (APPs -  Physician Assistants and Nurse Practitioners) who all work together to provide you with the care you need, when you need it. You will need a follow up appointment as needed   Any Other Special Instructions Will Be Listed Below (If Applicable). -Please schedule a follow up with your primary care physician to discuss a referral to  neurology  -Increase sodium in your diet  -Purchase and wear compression stockings daily         Signed, Parke Poisson, MD  07/07/2018 9:41 AM    [facility]

## 2018-07-15 ENCOUNTER — Telehealth: Payer: Self-pay

## 2018-07-15 DIAGNOSIS — R002 Palpitations: Secondary | ICD-10-CM

## 2018-07-15 DIAGNOSIS — R55 Syncope and collapse: Secondary | ICD-10-CM

## 2018-07-15 NOTE — Telephone Encounter (Signed)
Attempted to contact the pt x2 with Dr.Acharya's recommendation for her to have an ETT. Unable to lmom pt voicemail is not set up.

## 2018-07-15 NOTE — Telephone Encounter (Signed)
-----   Message from Parke Poisson, MD sent at 07/11/2018 11:37 AM EST ----- I'd like to set this patient up for a treadmill stress test ETT.  The indication is syncope and palpitations.   Thanks, Anselm Jungling

## 2018-07-18 NOTE — Telephone Encounter (Signed)
Spoke with the pt. Pt made aware of Dr.Acharya's recommendation for her to have a gxt. Pt is agreeable with plan. Adv the pt that a scheduler will call her to schedule.  Order placed in Epic. Message fwd to [facility] to call the pt to schedule.

## 2018-07-23 ENCOUNTER — Telehealth (HOSPITAL_COMMUNITY): Payer: Self-pay

## 2018-07-23 NOTE — Telephone Encounter (Signed)
Unable to reach patient. Encounter complete. 

## 2018-07-25 ENCOUNTER — Other Ambulatory Visit: Payer: Self-pay

## 2018-07-25 ENCOUNTER — Ambulatory Visit (HOSPITAL_COMMUNITY)
Admission: RE | Admit: 2018-07-25 | Discharge: 2018-07-25 | Disposition: A | Discharge status: Home / Self Care | Payer: Medicaid Other | Source: Ambulatory Visit | Attending: Cardiology | Admitting: Cardiology

## 2018-07-25 DIAGNOSIS — R002 Palpitations: Secondary | ICD-10-CM

## 2018-07-25 DIAGNOSIS — R55 Syncope and collapse: Secondary | ICD-10-CM | POA: Diagnosis present

## 2018-07-25 LAB — EXERCISE TOLERANCE TEST
Estimated workload: 9.5 METS
Exercise duration (min): 7 min
Exercise duration (sec): 37 s
MPHR: 201 {beats}/min
Peak HR: 155 {beats}/min
Percent HR: 77 %
RPE: 18
Rest HR: 84 {beats}/min

## 2018-07-31 ENCOUNTER — Telehealth: Payer: Self-pay

## 2018-07-31 NOTE — Telephone Encounter (Signed)
-----   Message from Parke Poisson, MD sent at 07/30/2018  2:24 PM EDT ----- No concerning findings on treadmill stress.   Note: I have concurrently reviewed the ECG tracings and agree with the assessment. I do not see arrhythmia, QRS widening, or concerning changes/deviations to suggest an electrophysiologic basis for syncope.

## 2018-07-31 NOTE — Telephone Encounter (Signed)
Attempted to contact the patient with her gxt results. Unable to lmom. Pt voicemail is not set up. Will attempt again.

## 2018-08-01 NOTE — Telephone Encounter (Signed)
2nd attempt to contact the pt with results. Unable to lmom, pt voicemail is not set up.

## 2018-08-08 NOTE — Telephone Encounter (Signed)
3rd attempt to contact the pt. Unable to lmom. Pt voice nail is not set up. Letter mailed to the pt to contact the office for test results.

## 2019-01-06 ENCOUNTER — Ambulatory Visit (HOSPITAL_COMMUNITY)
Admission: EM | Admit: 2019-01-06 | Discharge: 2019-01-06 | Disposition: A | Discharge status: Home / Self Care | Payer: Medicaid Other

## 2019-01-06 NOTE — ED Notes (Signed)
Patient states she has somewhere to be and needs to leave.  Staff notified.

## 2019-01-06 NOTE — ED Notes (Signed)
Kara Byrd, emt reported patient was not able to wait and left.

## 2019-02-15 ENCOUNTER — Emergency Department (HOSPITAL_COMMUNITY): Payer: Medicaid Other

## 2019-02-15 ENCOUNTER — Encounter (HOSPITAL_COMMUNITY): Payer: Self-pay | Admitting: Emergency Medicine

## 2019-02-15 ENCOUNTER — Emergency Department (HOSPITAL_COMMUNITY)
Admission: EM | Admit: 2019-02-15 | Discharge: 2019-02-15 | Disposition: A | Discharge status: Home / Self Care | Payer: Medicaid Other | Attending: Emergency Medicine | Admitting: Emergency Medicine

## 2019-02-15 DIAGNOSIS — N390 Urinary tract infection, site not specified: Secondary | ICD-10-CM | POA: Insufficient documentation

## 2019-02-15 DIAGNOSIS — Z793 Long term (current) use of hormonal contraceptives: Secondary | ICD-10-CM | POA: Insufficient documentation

## 2019-02-15 DIAGNOSIS — R1012 Left upper quadrant pain: Secondary | ICD-10-CM | POA: Insufficient documentation

## 2019-02-15 DIAGNOSIS — Z8709 Personal history of other diseases of the respiratory system: Secondary | ICD-10-CM | POA: Insufficient documentation

## 2019-02-15 DIAGNOSIS — R319 Hematuria, unspecified: Secondary | ICD-10-CM | POA: Diagnosis not present

## 2019-02-15 DIAGNOSIS — R1032 Left lower quadrant pain: Secondary | ICD-10-CM | POA: Diagnosis present

## 2019-02-15 DIAGNOSIS — F319 Bipolar disorder, unspecified: Secondary | ICD-10-CM | POA: Insufficient documentation

## 2019-02-15 LAB — URINALYSIS, ROUTINE W REFLEX MICROSCOPIC
Bilirubin Urine: NEGATIVE
Glucose, UA: NEGATIVE mg/dL
Ketones, ur: NEGATIVE mg/dL
Nitrite: NEGATIVE
Protein, ur: 30 mg/dL — AB
Specific Gravity, Urine: 1.017 (ref 1.005–1.030)
WBC, UA: >50 WBC/hpf — ABNORMAL HIGH (ref 0–5)
pH: 5 (ref 5.0–8.0)

## 2019-02-15 LAB — CBC
HCT: 39.1 % (ref 36.0–46.0)
Hemoglobin: 12.2 g/dL (ref 12.0–15.0)
MCH: 26.8 pg (ref 26.0–34.0)
MCHC: 31.2 g/dL (ref 30.0–36.0)
MCV: 85.9 fL (ref 80.0–100.0)
Platelets: 307 10*3/uL (ref 150–400)
RBC: 4.55 MIL/uL (ref 3.87–5.11)
RDW: 13.9 % (ref 11.5–15.5)
WBC: 8.1 10*3/uL (ref 4.0–10.5)
nRBC: 0 % (ref 0.0–0.2)

## 2019-02-15 LAB — COMPREHENSIVE METABOLIC PANEL
ALT: 16 U/L (ref 0–44)
AST: 23 U/L (ref 15–41)
Albumin: 3.9 g/dL (ref 3.5–5.0)
Alkaline Phosphatase: 70 U/L (ref 38–126)
Anion gap: 10 (ref 5–15)
BUN: 13 mg/dL (ref 6–20)
CO2: 24 mmol/L (ref 22–32)
Calcium: 9.1 mg/dL (ref 8.9–10.3)
Chloride: 105 mmol/L (ref 98–111)
Creatinine, Ser: 0.83 mg/dL (ref 0.44–1.00)
GFR calc Af Amer: >60 mL/min (ref >60)
GFR calc non Af Amer: >60 mL/min (ref >60)
Glucose, Bld: 105 mg/dL — ABNORMAL HIGH (ref 70–99)
Potassium: 3.8 mmol/L (ref 3.5–5.1)
Sodium: 139 mmol/L (ref 135–145)
Total Bilirubin: 0.8 mg/dL (ref 0.3–1.2)
Total Protein: 6.9 g/dL (ref 6.5–8.1)

## 2019-02-15 LAB — LIPASE, BLOOD: Lipase: 22 U/L (ref 11–51)

## 2019-02-15 LAB — I-STAT BETA HCG BLOOD, ED (MC, WL, AP ONLY): I-stat hCG, quantitative: <5 m[IU]/mL (ref <5)

## 2019-02-15 MED ORDER — KETOROLAC TROMETHAMINE 15 MG/ML IJ SOLN
15.0000 mg | Freq: Once | INTRAMUSCULAR | Status: AC
  Administered 2019-02-15: 15:00:00 15 mg via INTRAVENOUS
  Filled 2019-02-15: qty 1

## 2019-02-15 MED ORDER — MORPHINE SULFATE (PF) 4 MG/ML IV SOLN
4.0000 mg | Freq: Once | INTRAVENOUS | Status: AC
  Administered 2019-02-15: 4 mg via INTRAVENOUS
  Filled 2019-02-15: qty 1

## 2019-02-15 MED ORDER — CEPHALEXIN 500 MG PO CAPS: 500.0000 mg | ORAL_CAPSULE | Freq: Four times a day (QID) | ORAL | 0 refills | Status: AC

## 2019-02-15 MED ORDER — ONDANSETRON HCL 4 MG/2ML IJ SOLN
4.0000 mg | Freq: Once | INTRAMUSCULAR | Status: AC
  Administered 2019-02-15: 4 mg via INTRAVENOUS
  Filled 2019-02-15: qty 2

## 2019-02-15 MED ORDER — ONDANSETRON HCL 4 MG PO TABS: 4.0000 mg | ORAL_TABLET | Freq: Four times a day (QID) | ORAL | 0 refills | Status: DC

## 2019-02-15 MED ORDER — CEPHALEXIN 250 MG PO CAPS
500.0000 mg | ORAL_CAPSULE | Freq: Once | ORAL | Status: AC
  Administered 2019-02-15: 500 mg via ORAL
  Filled 2019-02-15: qty 2

## 2019-02-15 MED ORDER — KETOROLAC TROMETHAMINE 10 MG PO TABS: 10.0000 mg | ORAL_TABLET | Freq: Four times a day (QID) | ORAL | 0 refills | Status: DC | PRN

## 2019-02-15 MED ORDER — SODIUM CHLORIDE 0.9% FLUSH
3.0000 mL | Freq: Once | INTRAVENOUS | Status: AC
  Administered 2019-02-15: 3 mL via INTRAVENOUS

## 2019-02-15 NOTE — ED Provider Notes (Addendum)
[facility] Provider Note   CSN: [id number] Arrival date & time: 02/15/19  1143     History   Chief Complaint Chief Complaint  Patient presents with  . Abdominal Pain  . Flank Pain    HPI Kara Byrd is a 18 y.o. female.  Presents emergency department with severe left flank pain.  Symptoms since yesterday, states pain also in her left upper and lower abdomen, radiates across her abdomen.  Pain is 10/10 in severity, sharp, stabbing.  No alleviating factors, has not taken any medication for this.  Last menstrual period was early September.  Has not started her period yet.  No associated nausea, vomiting or diarrhea.  Did not note any hematuria or dysuria.     HPI  Past Medical History:  Diagnosis Date  . Asthma   . Bipolar 2 disorder ([place])   . Depression   . Heart palpitations     Patient Active Problem List   Diagnosis Date Noted  . Suicidal ideation 07/23/2017  . Right shoulder pain 09/13/2016  . 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 Recurrent ventral hernia 07/16/2016  . Right knee pain 02/02/2015    Past Surgical History:  Procedure Laterality Date  . HERNIA REPAIR       OB History   No obstetric history on file.      Home Medications    Prior to Admission medications   Medication Sig Start Date End Date Taking? Authorizing Provider  albuterol (PROVENTIL HFA;VENTOLIN HFA) 108 (90 Base) MCG/ACT inhaler Inhale 2 puffs into the lungs every 6 (six) hours as needed for shortness of breath. 12/20/15   [provider]  cetirizine (ZYRTEC) 10 MG tablet Take 10 mg by mouth as needed for allergies.    [provider]  drospirenone-ethinyl estradiol Sherrill Raring) 3-0.02 MG tablet Take by mouth. 04/28/18   [provider]  guaiFENesin (ROBITUSSIN) 100 MG/5ML liquid Take 5-10 mLs (100-200 mg total) by mouth every 4 (four) hours as needed for cough. 06/05/18   Chase Picket, MD   lamoTRIgine (LAMICTAL) 100 MG tablet Take 100 mg by mouth every evening.     [provider]  montelukast (SINGULAIR) 10 MG tablet Take 10 mg by mouth at bedtime as needed (allergies).     [provider]  norgestimate-ethinyl estradiol (ORTHO-CYCLEN,SPRINTEC,PREVIFEM) 0.25-35 MG-MCG tablet Take 1 tablet by mouth every evening.     [provider]    Family History Family History  Problem Relation Age of Onset  . Asthma Mother     Social History Social History   Tobacco Use  . Smoking status: Never Smoker  . Smokeless tobacco: Never Used  Substance Use Topics  . Alcohol use: No    Frequency: Never  . Drug use: No     Allergies   Amoxapine and related, Amoxicillin, and Aspirin   Review of Systems Review of Systems  Constitutional: Negative for chills and fever.  HENT: Negative for ear pain and sore throat.   Eyes: Negative for pain and visual disturbance.  Respiratory: Negative for cough and shortness of breath.   Cardiovascular: Negative for chest pain and palpitations.  Gastrointestinal: Positive for abdominal pain. Negative for vomiting.  Genitourinary: Negative for dysuria and hematuria.  Musculoskeletal: Negative for arthralgias and back pain.  Skin: Negative for color change and rash.  Neurological: Negative for seizures and syncope.  All other systems reviewed and are negative.    Physical Exam Updated Vital Signs BP 117/71  Pulse 78   Temp 99 F (37.2 C) (Oral)   Resp 16   Ht 5\' 3"  (1.6 m)   Wt 52.2 kg   LMP 01/16/2019   SpO2 100%   BMI 20.37 kg/m   Physical Exam Vitals signs and nursing note reviewed.  Constitutional:      General: She is not in acute distress.    Appearance: She is well-developed.  HENT:     Head: Normocephalic and atraumatic.  Eyes:     Conjunctiva/sclera: Conjunctivae normal.  Neck:     Musculoskeletal: Neck supple.  Cardiovascular:     Rate and Rhythm: Normal rate and regular rhythm.      Heart sounds: No murmur.  Pulmonary:     Effort: Pulmonary effort is normal. No respiratory distress.     Breath sounds: Normal breath sounds.  Abdominal:     General: Bowel sounds are normal.     Palpations: Abdomen is soft.     Tenderness: There is no guarding or rebound.     Comments: TTP in left flank, no CVA TTP  Skin:    General: Skin is warm and dry.  Neurological:     Mental Status: She is alert.      ED Treatments / Results  Labs (all labs ordered are listed, but only abnormal results are displayed) Labs Reviewed  COMPREHENSIVE METABOLIC PANEL - Abnormal; Notable for the following components:      Result Value   Glucose, Bld 105 (*)    All other components within normal limits  URINALYSIS, ROUTINE W REFLEX MICROSCOPIC - Abnormal; Notable for the following components:   APPearance HAZY (*)    Hgb urine dipstick MODERATE (*)    Protein, ur 30 (*)    Leukocytes,Ua MODERATE (*)    WBC, UA >50 (*)    Bacteria, UA MANY (*)    All other components within normal limits  LIPASE, BLOOD  CBC  I-STAT BETA HCG BLOOD, ED ([facility], [facility], [facility] ONLY)    EKG None  Radiology No results found.  Procedures Procedures (including critical care time)  Medications Ordered in ED Medications  cephALEXin (KEFLEX) capsule 500 mg (has no administration in time range)  sodium chloride flush (NS) 0.9 % injection 3 mL (3 mLs Intravenous Given 02/15/19 1351)  morphine 4 MG/ML injection 4 mg (4 mg Intravenous Given 02/15/19 1309)  ondansetron (ZOFRAN) injection 4 mg (4 mg Intravenous Given 02/15/19 1309)     Initial Impression / Assessment and Plan / ED Course  I have reviewed the triage vital signs and the nursing notes.  Pertinent labs & imaging results that were available during my care of the patient were reviewed by me and considered in my medical decision making (see chart for details).  Clinical Course as of Feb 14 1449  Sun Feb 15, 2019  1449 Recheck patient, symptoms improved, will  discharge home, or tolerating p.o., requesting 1 more dose pain medicine before DC, will also give first dose of antibiotics   [RD]    Clinical Course User Index [RD] Feb 17, 2019, MD      18 year old presents the ER with left flank pain.  Labs stable.  Vitals stable.  Urinalysis concerning for urinary tract infection.  CT without evidence for acute abdominal process, no nephrolithiasis.  Suspect symptoms related to UTI.  Will start on antibiotics, gave Rx for Zofran.  Recommend recheck with PCP, reviewed return precautions, tolerating p.o. at this time believe appropriate for outpatient management.  Will  discharge home.    After the discussed management above, the patient was determined to be safe for discharge.  The patient was in agreement with this plan and all questions regarding their care were answered.  ED return precautions were discussed and the patient will return to the ED with any significant worsening of condition.   Final Clinical Impressions(s) / ED Diagnoses   Final diagnoses:  Urinary tract infection with hematuria, site unspecified    ED Discharge Orders    None       Milagros Lollykstra, Sandeep Delagarza S, MD 02/15/19 1605    Milagros Lollykstra, Emmely Bittinger S, MD 02/28/19 1407

## 2019-02-15 NOTE — Discharge Instructions (Signed)
Please take the antibiotic as prescribed.  Take the prescribed Toradol as well as Tylenol for pain control.  Take Zofran as needed for nausea.  If you feel your pain worsens, you develop vomiting, fevers or other new concerning symptoms recommend returning to ER for reassessment.  Otherwise recommend following up with your primary doctor next week for recheck.

## 2019-02-15 NOTE — ED Triage Notes (Signed)
Pt here for eval of new onset left flank pain and left abdominal pain, radiating across the back. Hx of hernia repair with mesh problems. Pt states LMP last month. Denies N/V/D. No dysuria.

## 2019-02-15 NOTE — ED Notes (Signed)
Patient verbalizes understanding of discharge instructions. Opportunity for questioning and answers were provided. Armband removed by staff, pt discharged from ED via wheelchair to home.  

## 2019-07-16 ENCOUNTER — Encounter (HOSPITAL_COMMUNITY): Payer: Self-pay | Admitting: Emergency Medicine

## 2019-07-16 ENCOUNTER — Other Ambulatory Visit: Payer: Self-pay

## 2019-07-16 DIAGNOSIS — R102 Pelvic and perineal pain: Secondary | ICD-10-CM | POA: Diagnosis present

## 2019-07-16 DIAGNOSIS — Z9889 Other specified postprocedural states: Secondary | ICD-10-CM | POA: Diagnosis not present

## 2019-07-16 DIAGNOSIS — N760 Acute vaginitis: Secondary | ICD-10-CM | POA: Insufficient documentation

## 2019-07-16 DIAGNOSIS — N939 Abnormal uterine and vaginal bleeding, unspecified: Secondary | ICD-10-CM | POA: Diagnosis not present

## 2019-07-16 DIAGNOSIS — R1031 Right lower quadrant pain: Secondary | ICD-10-CM | POA: Insufficient documentation

## 2019-07-16 MED ORDER — SODIUM CHLORIDE 0.9% FLUSH: 3.0000 mL | Freq: Once | INTRAVENOUS | Status: DC

## 2019-07-16 NOTE — ED Triage Notes (Addendum)
Patient is complaining right flank pain and abdominal pain. Patient states it hurts more when she breathes in and out. Patient had abortion on 002/07/2019.

## 2019-07-17 ENCOUNTER — Emergency Department (HOSPITAL_COMMUNITY)
Admission: EM | Admit: 2019-07-17 | Discharge: 2019-07-17 | Disposition: A | Discharge status: Home / Self Care | Payer: Medicaid Other | Attending: Emergency Medicine | Admitting: Emergency Medicine

## 2019-07-17 ENCOUNTER — Emergency Department (HOSPITAL_COMMUNITY): Payer: Medicaid Other

## 2019-07-17 DIAGNOSIS — R102 Pelvic and perineal pain: Secondary | ICD-10-CM

## 2019-07-17 DIAGNOSIS — R109 Unspecified abdominal pain: Secondary | ICD-10-CM

## 2019-07-17 DIAGNOSIS — B9689 Other specified bacterial agents as the cause of diseases classified elsewhere: Secondary | ICD-10-CM

## 2019-07-17 DIAGNOSIS — N760 Acute vaginitis: Secondary | ICD-10-CM

## 2019-07-17 LAB — COMPREHENSIVE METABOLIC PANEL
ALT: 13 U/L (ref 0–44)
AST: 16 U/L (ref 15–41)
Albumin: 3.7 g/dL (ref 3.5–5.0)
Alkaline Phosphatase: 77 U/L (ref 38–126)
Anion gap: 8 (ref 5–15)
BUN: 15 mg/dL (ref 6–20)
CO2: 27 mmol/L (ref 22–32)
Calcium: 8.9 mg/dL (ref 8.9–10.3)
Chloride: 105 mmol/L (ref 98–111)
Creatinine, Ser: 0.69 mg/dL (ref 0.44–1.00)
GFR calc Af Amer: >60 mL/min (ref >60)
GFR calc non Af Amer: >60 mL/min (ref >60)
Glucose, Bld: 85 mg/dL (ref 70–99)
Potassium: 3.5 mmol/L (ref 3.5–5.1)
Sodium: 140 mmol/L (ref 135–145)
Total Bilirubin: 0.3 mg/dL (ref 0.3–1.2)
Total Protein: 7.6 g/dL (ref 6.5–8.1)

## 2019-07-17 LAB — CBC
HCT: 34.1 % — ABNORMAL LOW (ref 36.0–46.0)
Hemoglobin: 10.4 g/dL — ABNORMAL LOW (ref 12.0–15.0)
MCH: 26.1 pg (ref 26.0–34.0)
MCHC: 30.5 g/dL (ref 30.0–36.0)
MCV: 85.7 fL (ref 80.0–100.0)
Platelets: 407 10*3/uL — ABNORMAL HIGH (ref 150–400)
RBC: 3.98 MIL/uL (ref 3.87–5.11)
RDW: 13.3 % (ref 11.5–15.5)
WBC: 6 10*3/uL (ref 4.0–10.5)
nRBC: 0 % (ref 0.0–0.2)

## 2019-07-17 LAB — WET PREP, GENITAL
Sperm: NONE SEEN
Trich, Wet Prep: NONE SEEN
Yeast Wet Prep HPF POC: NONE SEEN

## 2019-07-17 LAB — LIPASE, BLOOD: Lipase: 21 U/L (ref 11–51)

## 2019-07-17 LAB — URINALYSIS, ROUTINE W REFLEX MICROSCOPIC
Bacteria, UA: NONE SEEN
Bilirubin Urine: NEGATIVE
Glucose, UA: NEGATIVE mg/dL
Ketones, ur: NEGATIVE mg/dL
Nitrite: NEGATIVE
Protein, ur: NEGATIVE mg/dL
Specific Gravity, Urine: 1.025 (ref 1.005–1.030)
pH: 6 (ref 5.0–8.0)

## 2019-07-17 LAB — PREGNANCY, URINE: Preg Test, Ur: NEGATIVE

## 2019-07-17 MED ORDER — METRONIDAZOLE 500 MG PO TABS: 500.0000 mg | ORAL_TABLET | Freq: Two times a day (BID) | ORAL | 0 refills | Status: AC

## 2019-07-17 MED ORDER — KETOROLAC TROMETHAMINE 15 MG/ML IJ SOLN
7.5000 mg | Freq: Once | INTRAMUSCULAR | Status: AC
  Administered 2019-07-17: 7.5 mg via INTRAVENOUS
  Filled 2019-07-17: qty 1

## 2019-07-17 MED ORDER — METRONIDAZOLE 500 MG PO TABS
500.0000 mg | ORAL_TABLET | Freq: Once | ORAL | Status: AC
  Administered 2019-07-17: 500 mg via ORAL
  Filled 2019-07-17: qty 1

## 2019-07-17 MED ORDER — KETOROLAC TROMETHAMINE 60 MG/2ML IM SOLN: 15.0000 mg | Freq: Once | INTRAMUSCULAR | Status: DC

## 2019-07-17 NOTE — ED Provider Notes (Signed)
[facility]-EMERGENCY DEPT Provider Note  CSN: [id number] Arrival date & time: 07/16/19 2251  Chief Complaint(s) Abdominal Pain  HPI Kara Byrd is a 19 y.o. G1P0 (1 induced abortion) female here for 1 month of pelvic and right flank pain since medical abortion. Had vaginal bleeding, which stopped for a few days and restarted. No vaginal discharge. Pain worse with palpation and movement. No alleviating factors. No urinary symptoms, Diarrhea, N/V. No fevers. No other physical complaints.  HPI  Past Medical History Past Medical History:  Diagnosis Date  . Asthma   . Bipolar 2 disorder (HCC)   . Depression   . Heart palpitations    Patient Active Problem List   Diagnosis Date Noted  . Suicidal ideation 07/23/2017  . Right shoulder pain 09/13/2016  . 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 Recurrent ventral hernia 07/16/2016  . Right knee pain 02/02/2015   Home Medication(s) Prior to Admission medications   Medication Sig Start Date End Date Taking? Authorizing Provider  albuterol (PROVENTIL HFA;VENTOLIN HFA) 108 (90 Base) MCG/ACT inhaler Inhale 2 puffs into the lungs every 6 (six) hours as needed for shortness of breath. 12/20/15  Yes [provider]  cetirizine (ZYRTEC) 10 MG tablet Take 10 mg by mouth as needed for allergies.   Yes [provider]  ipratropium-albuterol (DUONEB) 0.5-2.5 (3) MG/3ML SOLN Inhale 3 mLs into the lungs 4 (four) times daily as needed (sob).  07/14/19  Yes [provider]  lamoTRIgine (LAMICTAL) 150 MG tablet Take 150 mg by mouth daily.   Yes [provider]  ketorolac (TORADOL) 10 MG tablet Take 1 tablet (10 mg total) by mouth every 6 (six) hours as needed. Patient not taking: Reported on 07/17/2019 02/15/19   Milagros Loll, MD  metroNIDAZOLE (FLAGYL) 500 MG tablet Take 1 tablet (500 mg total) by mouth 2 (two) times daily for 7 days. 07/17/19 07/24/19  Nira Conn, MD  ondansetron  (ZOFRAN) 4 MG tablet Take 1 tablet (4 mg total) by mouth every 6 (six) hours. Patient not taking: Reported on 07/17/2019 02/15/19   Milagros Loll, MD                                                                                                                                    Past Surgical History Past Surgical History:  Procedure Laterality Date  . HERNIA REPAIR     Family History Family History  Problem Relation Age of Onset  . Asthma Mother     Social History Social History   Tobacco Use  . Smoking status: Never Smoker  . Smokeless tobacco: Never Used  Substance Use Topics  . Alcohol use: No  . Drug use: No   Allergies Amoxapine and related, Amoxicillin, and Aspirin  Review of Systems Review of Systems All other systems are reviewed and are negative for acute change except as noted in the HPI  Physical Exam  Vital Signs  I have reviewed the triage vital signs BP (!) 96/52   Pulse 68   Temp 98.2 F (36.8 C) (Oral)   Resp 14   Ht 5\' 3"  (1.6 m)   Wt 49.9 kg   LMP 07/10/2019   SpO2 99%   BMI 19.49 kg/m   Physical Exam Vitals reviewed.  Constitutional:      General: She is not in acute distress.    Appearance: She is well-developed. She is not diaphoretic.  HENT:     Head: Normocephalic and atraumatic.     Right Ear: External ear normal.     Left Ear: External ear normal.     Nose: Nose normal.  Eyes:     General: No scleral icterus.    Conjunctiva/sclera: Conjunctivae normal.  Neck:     Trachea: Phonation normal.  Cardiovascular:     Rate and Rhythm: Normal rate and regular rhythm.  Pulmonary:     Effort: Pulmonary effort is normal. No respiratory distress.     Breath sounds: No stridor.  Abdominal:     General: There is no distension.     Tenderness: There is abdominal tenderness in the right lower quadrant and suprapubic area. There is no guarding or rebound.  Genitourinary:    Vagina: Vaginal discharge (yellow brown) present. No erythema or  bleeding.     Cervix: No cervical motion tenderness, discharge or friability.     Uterus: Not tender.      Adnexa:        Right: No tenderness.         Left: No tenderness.    Musculoskeletal:        General: Normal range of motion.     Cervical back: Normal range of motion.  Neurological:     Mental Status: She is alert and oriented to person, place, and time.  Psychiatric:        Behavior: Behavior normal.     ED Results and Treatments Labs (all labs ordered are listed, but only abnormal results are displayed) Labs Reviewed  WET PREP, GENITAL - Abnormal; Notable for the following components:      Result Value   Clue Cells Wet Prep HPF POC PRESENT (*)    WBC, Wet Prep HPF POC MANY (*)    All other components within normal limits  CBC - Abnormal; Notable for the following components:   Hemoglobin 10.4 (*)    HCT 34.1 (*)    Platelets 407 (*)    All other components within normal limits  URINALYSIS, ROUTINE W REFLEX MICROSCOPIC - Abnormal; Notable for the following components:   APPearance HAZY (*)    Hgb urine dipstick SMALL (*)    Leukocytes,Ua SMALL (*)    All other components within normal limits  LIPASE, BLOOD  COMPREHENSIVE METABOLIC PANEL  PREGNANCY, URINE  GC/CHLAMYDIA PROBE AMP ([place]) NOT AT [facility]  EKG  EKG Interpretation  Date/Time:    Ventricular Rate:    PR Interval:    QRS Duration:   QT Interval:    QTC Calculation:   R Axis:     Text Interpretation:        Radiology US Transvaginal Non-OB  Result Date: 07/17/2019 CLINICAL DATA:  Pelvic pain, history of abortion 06/17/2019 EXAM: TRANSABDOMINAL ULTRASOUND OF PELVIS DOPPLER ULTRASOUND OF OVARIES TECHNIQUE: Transabdominal ultrasound examination of the pelvis was performed including evaluation of the uterus, ovaries, adnexal regions, and pelvic cul-de-sac. Color and duplex  Doppler ultrasound was utilized to evaluate blood flow to the ovaries. COMPARISON:  None. FINDINGS: Uterus Measurements: 7.2 x 3.4 x 4.2 cm = volume: 53.5 mL. No fibroids or other mass visualized. Endometrium Thickness: The endometrium measures 4.6 mm. There is slightly heterogeneous appearance to the endometrium with increased vascularity. No focal soft tissue mass. Right ovary Measurements: 3.8 x 2.1 x 2.0 = volume: 8.1 mL. Normal appearance/no adnexal mass. Left ovary Measurements: 4.0 x 1.6 x 2.6 = volume: 8.7 mL. Normal appearance/no adnexal mass. Pulsed Doppler evaluation demonstrates normal low-resistance arterial and venous waveforms in both ovaries. Other: Small free fluid within the cul-de-sac. IMPRESSION: Slightly heterogeneous endometrium with increased vascularity. However no soft tissue mass or fluid collections within the endometrial canal. Small free fluid within the cul-de-sac. Normal appearing ovaries Electronically Signed   By: Prudencio Pair M.D.   On: 07/17/2019 05:01   US Pelvis Complete  Result Date: 07/17/2019 CLINICAL DATA:  Pelvic pain, history of abortion 06/17/2019 EXAM: TRANSABDOMINAL ULTRASOUND OF PELVIS DOPPLER ULTRASOUND OF OVARIES TECHNIQUE: Transabdominal ultrasound examination of the pelvis was performed including evaluation of the uterus, ovaries, adnexal regions, and pelvic cul-de-sac. Color and duplex Doppler ultrasound was utilized to evaluate blood flow to the ovaries. COMPARISON:  None. FINDINGS: Uterus Measurements: 7.2 x 3.4 x 4.2 cm = volume: 53.5 mL. No fibroids or other mass visualized. Endometrium Thickness: The endometrium measures 4.6 mm. There is slightly heterogeneous appearance to the endometrium with increased vascularity. No focal soft tissue mass. Right ovary Measurements: 3.8 x 2.1 x 2.0 = volume: 8.1 mL. Normal appearance/no adnexal mass. Left ovary Measurements: 4.0 x 1.6 x 2.6 = volume: 8.7 mL. Normal appearance/no adnexal mass. Pulsed Doppler evaluation  demonstrates normal low-resistance arterial and venous waveforms in both ovaries. Other: Small free fluid within the cul-de-sac. IMPRESSION: Slightly heterogeneous endometrium with increased vascularity. However no soft tissue mass or fluid collections within the endometrial canal. Small free fluid within the cul-de-sac. Normal appearing ovaries Electronically Signed   By: Prudencio Pair M.D.   On: 07/17/2019 05:01   [place] Art/Ven Flow Abd Pelv Doppler  Result Date: 07/17/2019 CLINICAL DATA:  Pelvic pain, history of abortion 06/17/2019 EXAM: TRANSABDOMINAL ULTRASOUND OF PELVIS DOPPLER ULTRASOUND OF OVARIES TECHNIQUE: Transabdominal ultrasound examination of the pelvis was performed including evaluation of the uterus, ovaries, adnexal regions, and pelvic cul-de-sac. Color and duplex Doppler ultrasound was utilized to evaluate blood flow to the ovaries. COMPARISON:  None. FINDINGS: Uterus Measurements: 7.2 x 3.4 x 4.2 cm = volume: 53.5 mL. No fibroids or other mass visualized. Endometrium Thickness: The endometrium measures 4.6 mm. There is slightly heterogeneous appearance to the endometrium with increased vascularity. No focal soft tissue mass. Right ovary Measurements: 3.8 x 2.1 x 2.0 = volume: 8.1 mL. Normal appearance/no adnexal mass. Left ovary Measurements: 4.0 x 1.6 x 2.6 = volume: 8.7 mL. Normal appearance/no adnexal mass. Pulsed Doppler evaluation demonstrates normal low-resistance arterial and venous waveforms in  both ovaries. Other: Small free fluid within the cul-de-sac. IMPRESSION: Slightly heterogeneous endometrium with increased vascularity. However no soft tissue mass or fluid collections within the endometrial canal. Small free fluid within the cul-de-sac. Normal appearing ovaries Electronically Signed   By: Jonna Clark M.D.   On: 07/17/2019 05:01    Pertinent labs & imaging results that were available during my care of the patient were reviewed by me and considered in my medical decision making  (see chart for details).  Medications Ordered in ED Medications  sodium chloride flush (NS) 0.9 % injection 3 mL (has no administration in time range)  metroNIDAZOLE (FLAGYL) tablet 500 mg (has no administration in time range)  ketorolac (TORADOL) 15 MG/ML injection 7.5 mg (7.5 mg Intravenous Given 07/17/19 0313)                                                                                                                                    Procedures Procedures  (including critical care time)  Medical Decision Making / ED Course I have reviewed the nursing notes for this encounter and the patient's prior records (if available in EHR or on provided paperwork).   Rudie Javier was evaluated in Emergency Department on 07/17/2019 for the symptoms described in the history of present illness. She was evaluated in the context of the global COVID-19 pandemic, which necessitated consideration that the patient might be at risk for infection with the SARS-CoV-2 virus that causes COVID-19. Institutional protocols and algorithms that pertain to the evaluation of patients at risk for COVID-19 are in a state of rapid change based on information released by regulatory bodies including the CDC and federal and state organizations. These policies and algorithms were followed during the patient's care in the ED.  Status post medical abortion with pelvic and right flank pain. UPT negative. No leukocytosis or significant anemia.  No significant electrolyte derangements or renal sufficiency.  No evidence of a obstruction or pancreatitis. Urinalysis with small leuks and WBCs, likely from vaginal discharge.  Patient denies any urinary symptoms concerning for urinary tract infection. Pelvic exam was concerning for bacterial vaginosis which was confirmed by wet prep.  Trichomonas negative.  Exam was not concerning for cervicitis.  GC/chlamydia sent.  Will await results.  Pelvic ultrasound was obtained and did not  reveal evidence of retained products of conception.  No torsion.  Low suspicion for other serious intra-abdominal findings or/infectious process requiring further imaging at this time.      Final Clinical Impression(s) / ED Diagnoses Final diagnoses:  Bacterial vaginosis  Pelvic pain  Flank pain    The patient appears reasonably screened and/or stabilized for discharge and I doubt any other medical condition or other [facility] requiring further screening, evaluation, or treatment in the ED at this time prior to discharge. Safe for discharge with strict return precautions.  Disposition: Discharge  Condition: Good  I have discussed the results, Dx and  Tx plan with the patient/family who expressed understanding and agree(s) with the plan. Discharge instructions discussed at length. The patient/family was given strict return precautions who verbalized understanding of the instructions. No further questions at time of discharge.    ED Discharge Orders         Ordered    metroNIDAZOLE (FLAGYL) 500 MG tablet  2 times daily     07/17/19 0716          Follow Up: Toribio Harbour, PA-C [address] [place] [address] [phone]  Schedule an appointment as soon as possible for a visit  in 3-5 days, If symptoms do not improve or  worsen     This chart was dictated using voice recognition software.  Despite best efforts to proofread,  errors can occur which can change the documentation meaning.   Nira Conn, MD 07/17/19 [phone]

## 2019-07-18 LAB — GC/CHLAMYDIA PROBE AMP (~~LOC~~) NOT AT ARMC
Chlamydia: POSITIVE — AB
Neisseria Gonorrhea: NEGATIVE

## 2019-07-21 ENCOUNTER — Telehealth: Payer: Self-pay | Admitting: Student

## 2019-07-21 DIAGNOSIS — A749 Chlamydial infection, unspecified: Secondary | ICD-10-CM

## 2019-07-21 MED ORDER — AZITHROMYCIN 500 MG PO TABS: 1000.0000 mg | ORAL_TABLET | Freq: Once | ORAL | 0 refills | Status: AC

## 2019-07-21 NOTE — Telephone Encounter (Signed)
-----   Message from Kathe Becton, RN sent at 07/21/2019  1:18 PM EST ----- This patient tested positive for :  Chlamydia  She is allergic to:  "Amoxicillin, Amoxapine, and Aspirin,"  have informed the patient of her results and confirmed her pharmacy is correct in her chart. Please send Rx.   Thank you,   Kathe Becton, RN   Results faxed to [facility] Department.

## 2019-08-24 ENCOUNTER — Encounter (HOSPITAL_COMMUNITY): Payer: Self-pay

## 2019-08-24 ENCOUNTER — Other Ambulatory Visit: Payer: Self-pay

## 2019-08-24 ENCOUNTER — Ambulatory Visit (HOSPITAL_COMMUNITY)
Admission: EM | Admit: 2019-08-24 | Discharge: 2019-08-24 | Disposition: A | Discharge status: Home / Self Care | Payer: Medicaid Other | Attending: Family Medicine | Admitting: Family Medicine

## 2019-08-24 DIAGNOSIS — U071 COVID-19: Secondary | ICD-10-CM | POA: Diagnosis not present

## 2019-08-24 DIAGNOSIS — F3181 Bipolar II disorder: Secondary | ICD-10-CM | POA: Insufficient documentation

## 2019-08-24 DIAGNOSIS — Z79899 Other long term (current) drug therapy: Secondary | ICD-10-CM | POA: Diagnosis not present

## 2019-08-24 DIAGNOSIS — J45909 Unspecified asthma, uncomplicated: Secondary | ICD-10-CM | POA: Insufficient documentation

## 2019-08-24 DIAGNOSIS — Z886 Allergy status to analgesic agent status: Secondary | ICD-10-CM | POA: Diagnosis not present

## 2019-08-24 DIAGNOSIS — Z880 Allergy status to penicillin: Secondary | ICD-10-CM | POA: Insufficient documentation

## 2019-08-24 DIAGNOSIS — J302 Other seasonal allergic rhinitis: Secondary | ICD-10-CM | POA: Diagnosis not present

## 2019-08-24 DIAGNOSIS — R0981 Nasal congestion: Secondary | ICD-10-CM | POA: Diagnosis present

## 2019-08-24 MED ORDER — FLONASE SENSIMIST 27.5 MCG/SPRAY NA SUSP: 2.0000 | Freq: Every day | NASAL | 0 refills | Status: AC

## 2019-08-24 MED ORDER — OLOPATADINE HCL 0.1 % OP SOLN: 1.0000 [drp] | Freq: Two times a day (BID) | OPHTHALMIC | 0 refills | Status: AC

## 2019-08-24 MED ORDER — LEVOCETIRIZINE DIHYDROCHLORIDE 5 MG PO TABS: 5.0000 mg | ORAL_TABLET | Freq: Every evening | ORAL | 0 refills | Status: AC

## 2019-08-24 MED ORDER — MONTELUKAST SODIUM 5 MG PO CHEW: 5.0000 mg | CHEWABLE_TABLET | Freq: Every day | ORAL | 0 refills | Status: AC

## 2019-08-24 NOTE — Discharge Instructions (Signed)
Begin daily xyzal/levocetirizine and singulair for allergies Flonase nasal spray Olopatadine for eye watering/itching  Follow up if not improving or worsening

## 2019-08-24 NOTE — ED Provider Notes (Signed)
[place]    CSN: [id number] Arrival date & time: 08/24/19  1712      History   Chief Complaint Chief Complaint  Patient presents with  . Allergies    HPI Kara Byrd is a 19 y.o. female history of asthma, allergic rhinitis, presenting today for evaluation of her allergies.  Patient notes that over the past 2 days she has had nasal congestion, sneezing as well as watery eyes and itching.  She has been on Zyrtec in the past, but feels this has not been helpful of recently.  She denies any fevers chills or body aches.  Occasionally will have a slight pressure sensation behind eyes if outdoors.  Normal oral intake.  HPI  Past Medical History:  Diagnosis Date  . Asthma   . Bipolar 2 disorder ([place])   . Depression   . Heart palpitations     Patient Active Problem List   Diagnosis Date Noted  . Suicidal ideation 07/23/2017  . Right shoulder pain 09/13/2016  . 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 Recurrent ventral hernia 07/16/2016  . Right knee pain 02/02/2015    Past Surgical History:  Procedure Laterality Date  . HERNIA REPAIR      OB History   No obstetric history on file.      Home Medications    Prior to Admission medications   Medication Sig Start Date End Date Taking? Authorizing Provider  albuterol (PROVENTIL HFA;VENTOLIN HFA) 108 (90 Base) MCG/ACT inhaler Inhale 2 puffs into the lungs every 6 (six) hours as needed for shortness of breath. 12/20/15   [provider]  cetirizine (ZYRTEC) 10 MG tablet Take 10 mg by mouth as needed for allergies.    [provider]  fluticasone (FLONASE SENSIMIST) 27.5 MCG/SPRAY nasal spray Place 2 sprays into the nose daily. 08/24/19   Adelene Polivka C, PA-C  ipratropium-albuterol (DUONEB) 0.5-2.5 (3) MG/3ML SOLN Inhale 3 mLs into the lungs 4 (four) times daily as needed (sob).  07/14/19   [provider]  lamoTRIgine (LAMICTAL) 150 MG tablet Take 150 mg by mouth daily.     [provider]  levocetirizine (XYZAL) 5 MG tablet Take 1 tablet (5 mg total) by mouth every evening. 08/24/19   Rosalin Buster C, PA-C  montelukast (SINGULAIR) 5 MG chewable tablet Chew 1 tablet (5 mg total) by mouth at bedtime. 08/24/19   Feleshia Zundel C, PA-C  olopatadine (PATANOL) 0.1 % ophthalmic solution Place 1 drop into both eyes 2 (two) times daily. 08/24/19   Jahmiya Guidotti, Elesa Hacker, PA-C    Family History Family History  Problem Relation Age of Onset  . Asthma Mother     Social History Social History   Tobacco Use  . Smoking status: Never Smoker  . Smokeless tobacco: Never Used  Substance Use Topics  . Alcohol use: No  . Drug use: No     Allergies   Amoxapine and related, Amoxicillin, and Aspirin   Review of Systems Review of Systems  Constitutional: Negative for activity change, appetite change, chills, fatigue and fever.  HENT: Positive for congestion, rhinorrhea and sinus pressure. Negative for ear pain, sore throat and trouble swallowing.   Eyes: Positive for discharge and itching. Negative for redness.  Respiratory: Negative for cough, chest tightness and shortness of breath.   Cardiovascular: Negative for chest pain.  Gastrointestinal: Negative for abdominal pain, diarrhea, nausea and vomiting.  Musculoskeletal: Negative for myalgias.  Skin: Negative for rash.  Neurological: Negative for dizziness, light-headedness and headaches.  Physical Exam Triage Vital Signs ED Triage Vitals  Enc Vitals Group     BP 08/24/19 1750 117/77     Pulse Rate 08/24/19 1750 98     Resp 08/24/19 1750 18     Temp 08/24/19 1750 98.8 F (37.1 C)     Temp Source 08/24/19 1750 Oral     SpO2 08/24/19 1750 100 %     Weight --      Height --      Head Circumference --      Peak Flow --      Pain Score 08/24/19 1749 4     Pain Loc --      Pain Edu? --      Excl. in GC? --    No data found.  Updated Vital Signs BP 117/77 (BP Location: Left Arm)   Pulse 98    Temp 98.8 F (37.1 C) (Oral)   Resp 18   SpO2 100%   Visual Acuity Right Eye Distance:   Left Eye Distance:   Bilateral Distance:    Right Eye Near:   Left Eye Near:    Bilateral Near:     Physical Exam Vitals and nursing note reviewed.  Constitutional:      Appearance: She is well-developed.     Comments: No acute distress  HENT:     Head: Normocephalic and atraumatic.     Ears:     Comments: Bilateral ears without tenderness to palpation of external auricle, tragus and mastoid, EAC's without erythema or swelling, TM's with good bony landmarks and cone of light. Non erythematous.     Nose: Nose normal.     Mouth/Throat:     Comments: Oral mucosa pink and moist, no tonsillar enlargement or exudate. Posterior pharynx patent and nonerythematous, no uvula deviation or swelling. Normal phonation. Eyes:     Conjunctiva/sclera: Conjunctivae normal.  Cardiovascular:     Rate and Rhythm: Normal rate.  Pulmonary:     Effort: Pulmonary effort is normal. No respiratory distress.     Comments: Breathing comfortably at rest, CTABL, no wheezing, rales or other adventitious sounds auscultated Abdominal:     General: There is no distension.  Musculoskeletal:        General: Normal range of motion.     Cervical back: Neck supple.  Skin:    General: Skin is warm and dry.  Neurological:     Mental Status: She is alert and oriented to person, place, and time.      UC Treatments / Results  Labs (all labs ordered are listed, but only abnormal results are displayed) Labs Reviewed  SARS CORONAVIRUS 2 (TAT 6-24 HRS)    EKG   Radiology No results found.  Procedures Procedures (including critical care time)  Medications Ordered in UC Medications - No data to display  Initial Impression / Assessment and Plan / UC Course  I have reviewed the triage vital signs and the nursing notes.  Pertinent labs & imaging results that were available during my care of the patient were  reviewed by me and considered in my medical decision making (see chart for details).     Covid PCR pending for screening.  Likely allergic rhinitis.  Will try Xyzal as alternative to cetirizine, Singulair given associated asthma.  Flonase nasal spray and olopatadine.  Monitor for gradual resolution of symptoms.  Discussed strict return precautions. Patient verbalized understanding and is agreeable with plan.  Final Clinical Impressions(s) / UC Diagnoses   Final  diagnoses:  Seasonal allergic rhinitis, unspecified trigger     Discharge Instructions     Begin daily xyzal/levocetirizine and singulair for allergies Flonase nasal spray Olopatadine for eye watering/itching  Follow up if not improving or worsening   ED Prescriptions    Medication Sig Dispense Auth. Provider   levocetirizine (XYZAL) 5 MG tablet Take 1 tablet (5 mg total) by mouth every evening. 30 tablet Jameal Razzano C, PA-C   montelukast (SINGULAIR) 5 MG chewable tablet Chew 1 tablet (5 mg total) by mouth at bedtime. 30 tablet Klea Nall C, PA-C   fluticasone (FLONASE SENSIMIST) 27.5 MCG/SPRAY nasal spray Place 2 sprays into the nose daily. 10 g Joyclyn Plazola C, PA-C   olopatadine (PATANOL) 0.1 % ophthalmic solution Place 1 drop into both eyes 2 (two) times daily. 5 mL Raiven Belizaire, [place] C, PA-C     PDMP not reviewed this encounter.   Lew Dawes, [place] 08/24/19 1842

## 2019-08-24 NOTE — ED Triage Notes (Signed)
Pt reports she has seasonal allergies and she being sneezing and nasal congestion x 2 days, if she is outdoors. Pt reports having pain behind her ears and eyes x 2 days.

## 2019-08-25 LAB — SARS CORONAVIRUS 2 (TAT 6-24 HRS): SARS Coronavirus 2: POSITIVE — AB

## 2019-10-19 IMAGING — CT CT RENAL STONE PROTOCOL
2 of 4 series · 16 of 46 positions shown, 18 images · non-contrast
Comparison: None.

CLINICAL DATA: Left upper and lower abdominal pain and with back
pain. Possible renal stone disease.

EXAM:
CT ABDOMEN AND PELVIS WITHOUT CONTRAST
TECHNIQUE: Multidetector CT imaging of the abdomen and pelvis was performed
following the standard protocol without IV contrast.

[Series 3: ap without · axial · non-contrast · 0.73mm/px · z∈[+867,+1242]mm · 13 of 85 slices shown, 15 images]
[im 5/85  soft-tissue]
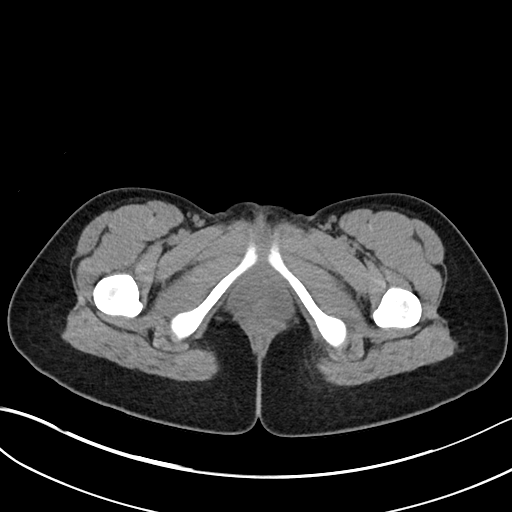
[im 5/85  bone]
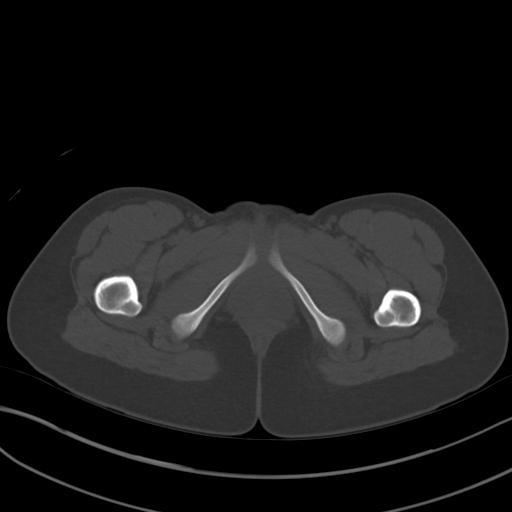
[im 13/85  soft-tissue]
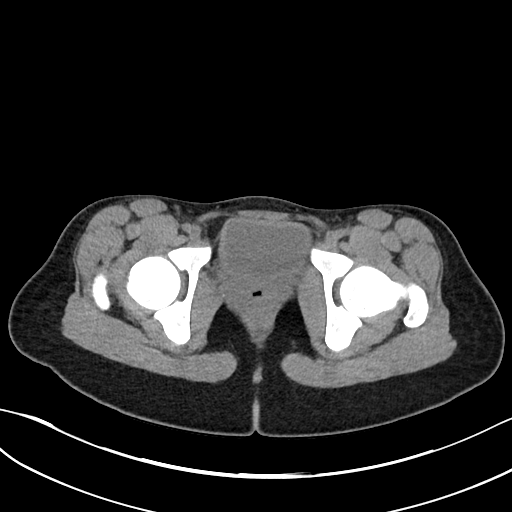
[im 17/85  soft-tissue]
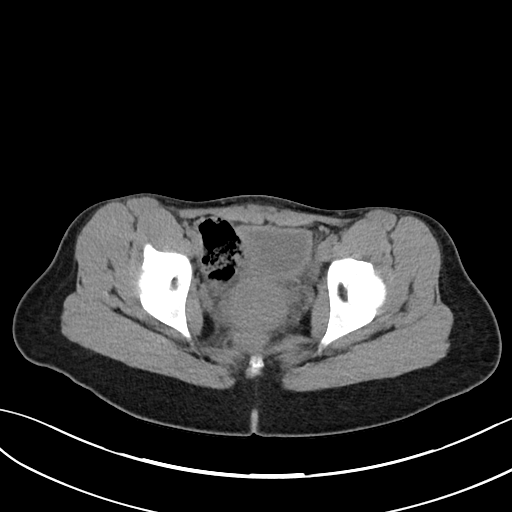
[im 26/85  soft-tissue]
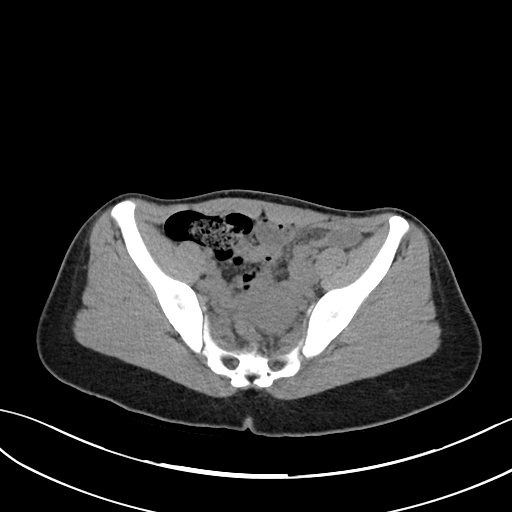
[im 30/85  soft-tissue]
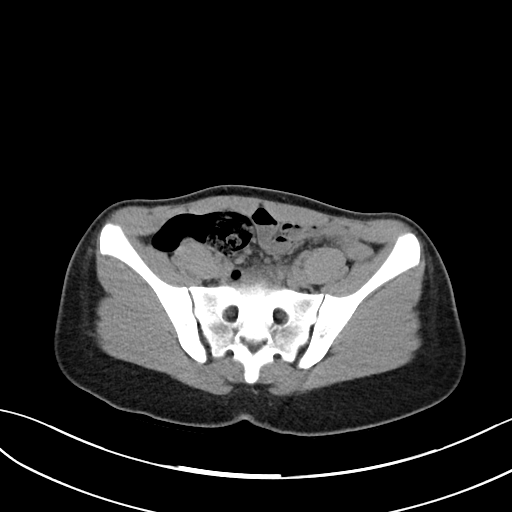
[im 38/85  soft-tissue]
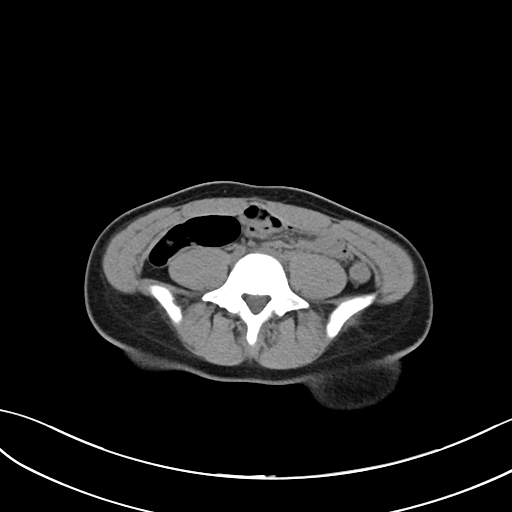
[im 43/85  soft-tissue]
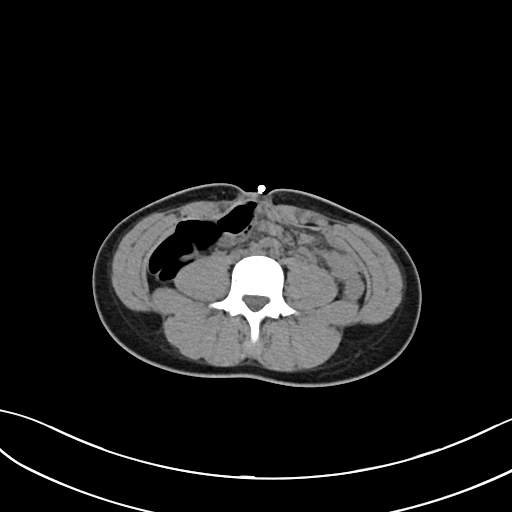
[im 47/85  soft-tissue]
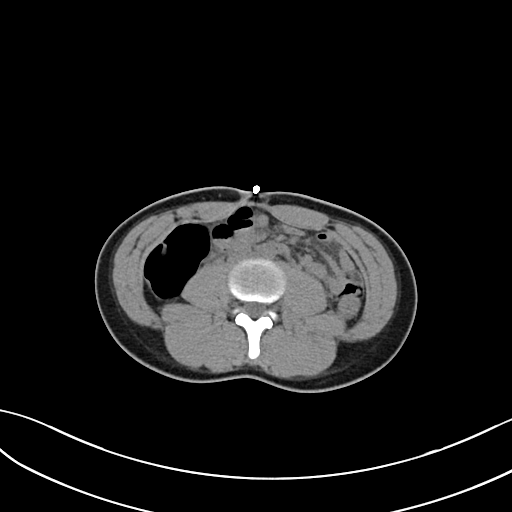
[im 55/85  soft-tissue]
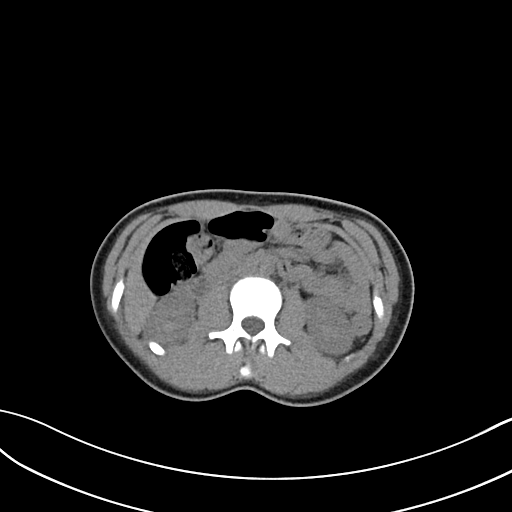
[im 55/85  bone]
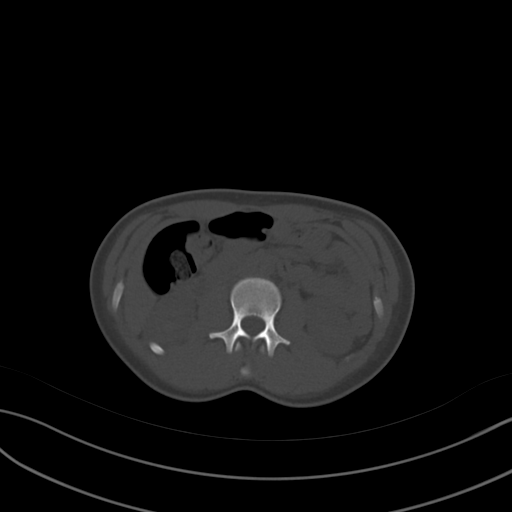
[im 59/85  soft-tissue]
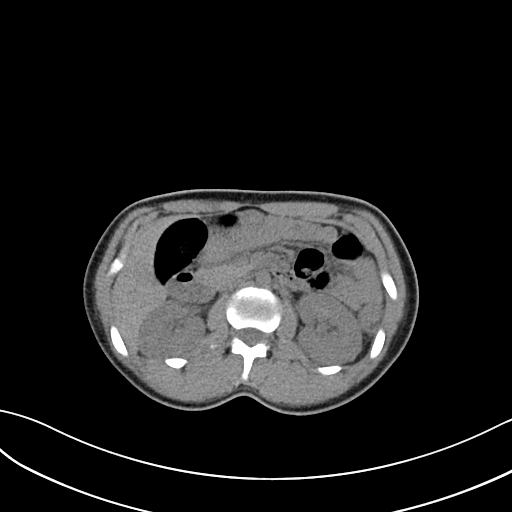
[im 68/85  soft-tissue]
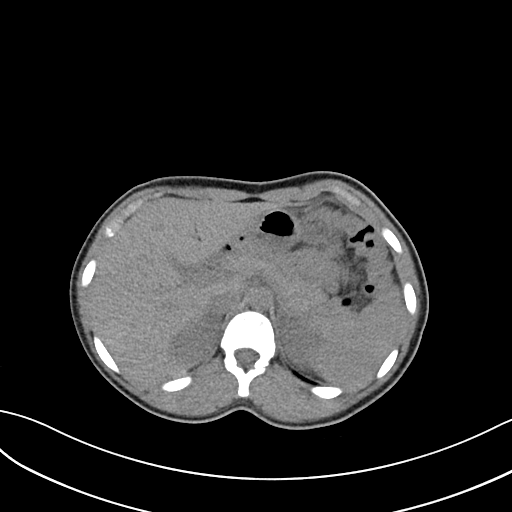
[im 72/85  soft-tissue]
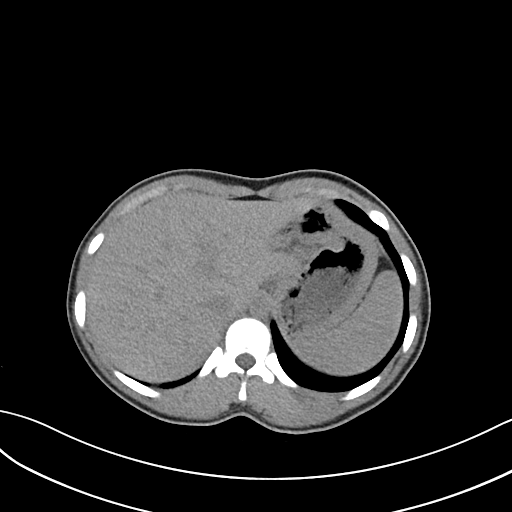
[im 80/85  soft-tissue]
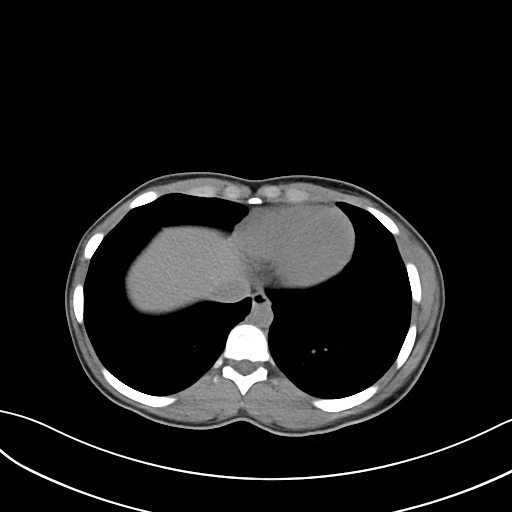

[Series 6: cor · coronal · 0.56mm/px · 3 of 61 slices shown]
[im 21/61  soft-tissue]
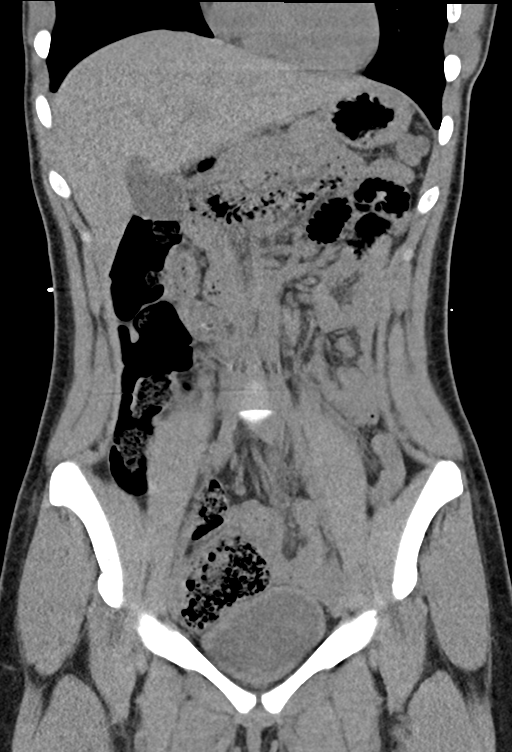
[im 27/61  soft-tissue]
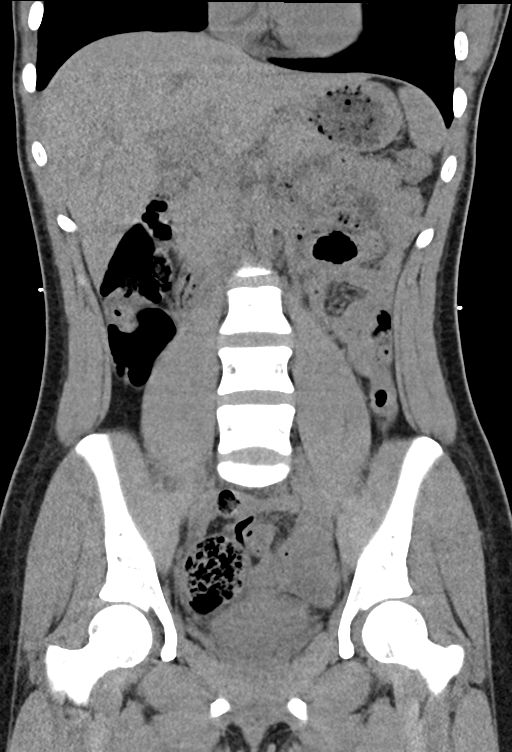
[im 34/61  soft-tissue]
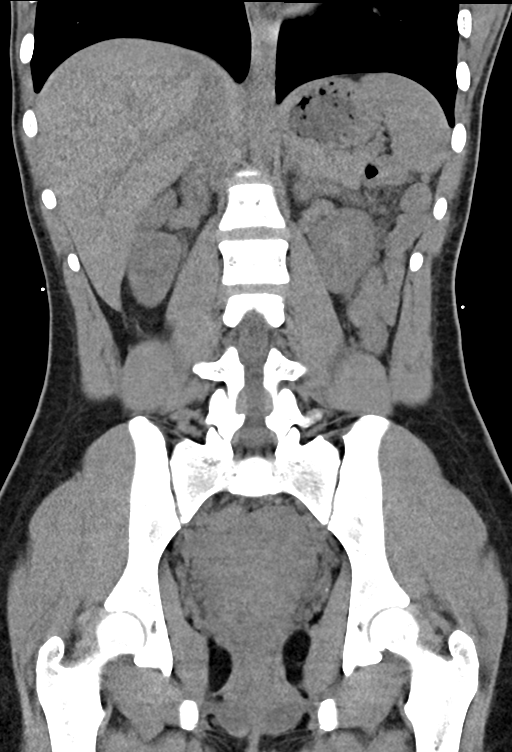

[16 of 46 positions shown; findings below may reference images not displayed]

FINDINGS: Lower chest: Lung bases are normal.

Hepatobiliary: Liver, gallbladder and biliary tree are normal.

Pancreas: Normal.

Spleen: Normal.

Adrenals/Urinary Tract: Adrenal glands are normal. Kidneys are
normal in size without hydronephrosis or nephrolithiasis. Ureters
and bladder are normal.

Stomach/Bowel: Stomach and small bowel are normal. Appendix is
normal. Colon is within normal.

Vascular/Lymphatic: Normal.

Reproductive: Normal.  Tiny amount of free fluid in the cul-de-sac.

Other: No focal inflammatory process.  No free peritoneal air.

Musculoskeletal: Normal.
IMPRESSION: No acute findings in the abdomen/pelvis.

## 2019-10-28 ENCOUNTER — Emergency Department (HOSPITAL_COMMUNITY)
Admission: EM | Admit: 2019-10-28 | Discharge: 2019-10-28 | Disposition: A | Discharge status: Home / Self Care | Payer: Medicaid Other | Attending: Emergency Medicine | Admitting: Emergency Medicine

## 2019-10-28 ENCOUNTER — Encounter (HOSPITAL_COMMUNITY): Payer: Self-pay | Admitting: Emergency Medicine

## 2019-10-28 ENCOUNTER — Emergency Department (HOSPITAL_COMMUNITY): Payer: Medicaid Other

## 2019-10-28 DIAGNOSIS — Y998 Other external cause status: Secondary | ICD-10-CM | POA: Diagnosis not present

## 2019-10-28 DIAGNOSIS — Y9389 Activity, other specified: Secondary | ICD-10-CM | POA: Insufficient documentation

## 2019-10-28 DIAGNOSIS — M542 Cervicalgia: Secondary | ICD-10-CM | POA: Insufficient documentation

## 2019-10-28 DIAGNOSIS — Y9289 Other specified places as the place of occurrence of the external cause: Secondary | ICD-10-CM | POA: Insufficient documentation

## 2019-10-28 DIAGNOSIS — R55 Syncope and collapse: Secondary | ICD-10-CM | POA: Insufficient documentation

## 2019-10-28 DIAGNOSIS — S0990XA Unspecified injury of head, initial encounter: Secondary | ICD-10-CM

## 2019-10-28 DIAGNOSIS — W1830XA Fall on same level, unspecified, initial encounter: Secondary | ICD-10-CM | POA: Insufficient documentation

## 2019-10-28 LAB — CBC WITH DIFFERENTIAL/PLATELET
Abs Immature Granulocytes: 0.01 10*3/uL (ref 0.00–0.07)
Basophils Absolute: 0 10*3/uL (ref 0.0–0.1)
Basophils Relative: 0 %
Eosinophils Absolute: 0.1 10*3/uL (ref 0.0–0.5)
Eosinophils Relative: 1 %
HCT: 37.9 % (ref 36.0–46.0)
Hemoglobin: 11.9 g/dL — ABNORMAL LOW (ref 12.0–15.0)
Immature Granulocytes: 0 %
Lymphocytes Relative: 35 %
Lymphs Abs: 1.9 10*3/uL (ref 0.7–4.0)
MCH: 25.2 pg — ABNORMAL LOW (ref 26.0–34.0)
MCHC: 31.4 g/dL (ref 30.0–36.0)
MCV: 80.3 fL (ref 80.0–100.0)
Monocytes Absolute: 0.4 10*3/uL (ref 0.1–1.0)
Monocytes Relative: 7 %
Neutro Abs: 3 10*3/uL (ref 1.7–7.7)
Neutrophils Relative %: 57 %
Platelets: 274 10*3/uL (ref 150–400)
RBC: 4.72 MIL/uL (ref 3.87–5.11)
RDW: 17 % — ABNORMAL HIGH (ref 11.5–15.5)
WBC: 5.3 10*3/uL (ref 4.0–10.5)
nRBC: 0 % (ref 0.0–0.2)

## 2019-10-28 LAB — I-STAT BETA HCG BLOOD, ED (MC, WL, AP ONLY): I-stat hCG, quantitative: <5 m[IU]/mL (ref <5)

## 2019-10-28 LAB — BASIC METABOLIC PANEL
Anion gap: 12 (ref 5–15)
BUN: 18 mg/dL (ref 6–20)
CO2: 24 mmol/L (ref 22–32)
Calcium: 9.1 mg/dL (ref 8.9–10.3)
Chloride: 105 mmol/L (ref 98–111)
Creatinine, Ser: 0.77 mg/dL (ref 0.44–1.00)
GFR calc Af Amer: >60 mL/min (ref >60)
GFR calc non Af Amer: >60 mL/min (ref >60)
Glucose, Bld: 94 mg/dL (ref 70–99)
Potassium: 4 mmol/L (ref 3.5–5.1)
Sodium: 141 mmol/L (ref 135–145)

## 2019-10-28 MED ORDER — LAMOTRIGINE 25 MG PO TABS
150.0000 mg | ORAL_TABLET | Freq: Every day | ORAL | Status: DC
  Administered 2019-10-28: 150 mg via ORAL
  Filled 2019-10-28: qty 2

## 2019-10-28 MED ORDER — PROCHLORPERAZINE EDISYLATE 10 MG/2ML IJ SOLN
10.0000 mg | Freq: Once | INTRAMUSCULAR | Status: AC
  Administered 2019-10-28: 10 mg via INTRAVENOUS
  Filled 2019-10-28: qty 2

## 2019-10-28 MED ORDER — DIPHENHYDRAMINE HCL 50 MG/ML IJ SOLN
12.5000 mg | Freq: Once | INTRAMUSCULAR | Status: AC
  Administered 2019-10-28: 12.5 mg via INTRAVENOUS
  Filled 2019-10-28: qty 1

## 2019-10-28 NOTE — ED Notes (Signed)
Patient requesting to leave and does not want to get scans. MD aware

## 2019-10-28 NOTE — Discharge Instructions (Signed)
You are leaving against medical advised. I am unable to rule out any abnormalities because I did not have your scans back. You are welcome back at anytime for further assessment.

## 2019-10-28 NOTE — ED Triage Notes (Signed)
Patient here from [facility] outside of apartment complex complaining of fall today. Bystanders stated that patient just fell backwards. Reports neck and back pain. C-collar in place.

## 2019-10-28 NOTE — ED Provider Notes (Signed)
[facility]-EMERGENCY DEPT Provider Note   CSN: [id number] Arrival date & time: 10/28/19  1615     History Chief Complaint  Patient presents with  . Fall  . Neck Pain    Kara Byrd is a 19 y.o. female with a past medical history significant for asthma, bipolar 2 disorder, depression, and functional seizure disorder who presents to the ED after a syncopal episode that occurred just prior to arrival.  Patient states she felt lightheaded prior to the episode which caused her to fall completely backwards and hit the posterior aspect of her head.  Patient unsure how long she was unconscious for.  Patient notes she has been taking Lamictal for possible seizure disorder.  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  She states she has been compliant with her medication.  Patient admits to a posterior headache associated with photophobia and changes to speech.  Denies unilateral weakness and facial droop.  Patient also admits to thoracic back pain and neck pain.  She is not currently on any blood thinners.  No treatment prior to arrival.   History obtained from patient and past medical records. No interpreter used during encounter.      Past Medical History:  Diagnosis Date  . Asthma   . Bipolar 2 disorder (HCC)   . Depression   . Heart palpitations     Patient Active Problem List   Diagnosis Date Noted  . Suicidal ideation 07/23/2017  . Right shoulder pain 09/13/2016  . 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 Recurrent ventral hernia 07/16/2016  . Right knee pain 02/02/2015    Past Surgical History:  Procedure Laterality Date  . HERNIA REPAIR       OB History   No obstetric history on file.     Family History  Problem Relation Age of Onset  . Asthma Mother     Social History   Tobacco Use  . Smoking status: Never Smoker  . Smokeless tobacco:  Never Used  Substance Use Topics  . Alcohol use: No  . Drug use: No    Home Medications Prior to Admission medications   Medication Sig Start Date End Date Taking? Authorizing Provider  albuterol (PROVENTIL HFA;VENTOLIN HFA) 108 (90 Base) MCG/ACT inhaler Inhale 2 puffs into the lungs every 6 (six) hours as needed for shortness of breath. 12/20/15   [provider]  cetirizine (ZYRTEC) 10 MG tablet Take 10 mg by mouth as needed for allergies.    [provider]  fluticasone (FLONASE SENSIMIST) 27.5 MCG/SPRAY nasal spray Place 2 sprays into the nose daily. 08/24/19   Wieters, Hallie C, PA-C  ipratropium-albuterol (DUONEB) 0.5-2.5 (3) MG/3ML SOLN Inhale 3 mLs into the lungs 4 (four) times daily as needed (sob).  07/14/19   [provider]  lamoTRIgine (LAMICTAL) 150 MG tablet Take 150 mg by mouth daily.    [provider]  levocetirizine (XYZAL) 5 MG tablet Take 1 tablet (5 mg total) by mouth every evening. 08/24/19   Wieters, Hallie C, PA-C  montelukast (SINGULAIR) 5 MG chewable tablet Chew 1 tablet (5 mg total) by mouth at bedtime. 08/24/19   Wieters, Hallie C, PA-C  olopatadine (PATANOL) 0.1 % ophthalmic solution Place 1 drop into both eyes 2 (two) times daily. 08/24/19   Wieters, Hallie C, PA-C    Allergies    Amoxapine and related, Amoxicillin, and Aspirin  Review of Systems  Review of Systems  Constitutional: Negative for chills and fever.  Eyes: Positive for photophobia. Negative for visual disturbance.  Respiratory: Negative for shortness of breath.   Cardiovascular: Negative for chest pain.  Gastrointestinal: Negative for abdominal pain, nausea and vomiting.  Musculoskeletal: Positive for back pain and neck pain.  Neurological: Positive for seizures (possible), syncope, speech difficulty, light-headedness and headaches. Negative for facial asymmetry and weakness.  All other systems reviewed and are negative.   Physical Exam Updated Vital Signs BP  (!) 94/54 (BP Location: Right Arm)   Pulse 98   Temp 98.2 F (36.8 C) (Oral)   Resp 17   SpO2 99%   Physical Exam Vitals and nursing note reviewed.  Constitutional:      General: She is not in acute distress.    Appearance: She is not toxic-appearing.  HENT:     Head: Normocephalic.     Comments: No battle sign, raccoon eyes, hemotympanum, malocclusion, or septal hematomas.  No visible deformity of skull. Eyes:     Pupils: Pupils are equal, round, and reactive to light.  Neck:     Comments: C-collar in place.  Cervical midline tenderness. Cardiovascular:     Rate and Rhythm: Normal rate and regular rhythm.     Pulses: Normal pulses.     Heart sounds: Normal heart sounds. No murmur heard.  No friction rub. No gallop.   Pulmonary:     Effort: Pulmonary effort is normal.     Breath sounds: Normal breath sounds.  Abdominal:     General: Abdomen is flat. Bowel sounds are normal. There is no distension.     Palpations: Abdomen is soft.  Musculoskeletal:     Cervical back: Neck supple.     Comments: Thoracic midline tenderness.  No lumbar midline tenderness.  Skin:    General: Skin is warm and dry.  Neurological:     General: No focal deficit present.     Mental Status: She is alert.     Comments: Patient stuttering, but able to follow commands CN III-XII intact Normal strength in upper and lower extremities bilaterally including dorsiflexion and plantar flexion, strong and equal grip strength Sensation grossly intact throughout Moves extremities without ataxia, coordination intact No pronator drift Ambulates without difficulty   Psychiatric:        Mood and Affect: Mood normal.        Behavior: Behavior normal.     ED Results / Procedures / Treatments   Labs (all labs ordered are listed, but only abnormal results are displayed) Labs Reviewed  CBC WITH DIFFERENTIAL/PLATELET  BASIC METABOLIC PANEL  I-STAT BETA HCG BLOOD, ED ([facility], [facility], [facility] ONLY)    EKG EKG  Interpretation  Date/Time:  Wednesday October 28 2019 16:27:06 EDT Ventricular Rate:  97 PR Interval:    QRS Duration: 73 QT Interval:  307 QTC Calculation: 390 R Axis:   82 Text Interpretation: Sinus rhythm 12 Lead; Mason-Likar No significant change since last tracing Confirmed by Richardean Canal [phone]) on 10/28/2019 5:29:08 PM   Radiology No results found.  Procedures Procedures (including critical care time)  Medications Ordered in ED Medications  lamoTRIgine (LAMICTAL) tablet 150 mg (150 mg Oral Given 10/28/19 1708)  diphenhydrAMINE (BENADRYL) injection 12.5 mg (12.5 mg Intravenous Given 10/28/19 1710)  prochlorperazine (COMPAZINE) injection 10 mg (10 mg Intravenous Given 10/28/19 1710)    ED Course  I have reviewed the triage vital signs and the nursing notes.  Pertinent labs & imaging results that were  available during my care of the patient were reviewed by me and considered in my medical decision making (see chart for details).    MDM Rules/Calculators/A&P                         19 year old female presents to the ED after a syncopal episode that occurred just prior to arrival.  Patient has a history of functional neurological disorder and is currently on Lamictal which she has been compliant with.  Unsure if she had a seizure or not.  Stable vitals.  Patient in no acute distress and nontoxic-appearing.  Physical exam significant for cervical and thoracic midline tenderness.  C-collar in place.  No lumbar midline tenderness.  Slight stuttering speech, but otherwise normal neurological exam.  Will obtain CT head and cervical spine to rule out any acute abnormalities. Discussed with Dr. Darl Householder about thoracic midline tenderness and it was felt thoracic x-ray would sufficient given low suspicion for bony fracture. No concern for cauda equina or central cord compression.  No signs of basilar skull fracture on exam.  Will also obtain routine labs to rule out any electrolyte abnormalities.  Of  note, triage noted patient to be tachycardic at 101 however during my initial evaluation patient's HR in the 80-90s. Patient denies chest pain and shortness of breath. Low suspicion for PE/DVT.   EKG personally reviewed which demonstrates normal sinus rhythm with no signs of acute ischemia.  Pregnancy test negative.  5:35 PM Informed by RN that patient would like to leave AMA. Reported to bedside to talk with patient. Mother is now at bedside. I have discussed my concerns as his provider and the possibility that this may worsen. I have specifically discussed that without further evaluation I cannot guarantee there is not a life threatening event occuring.  Pt is A&Ox4, his own POA and states understanding of my concerns and the possible consequences.  I have made pt aware that this is an [facility] discharge, but he may return at any time for further evaluation and treatment.  Final Clinical Impression(s) / ED Diagnoses Final diagnoses:  Syncope, unspecified syncope type  Injury of head, initial encounter  Neck pain    Rx / DC Orders ED Discharge Orders    None       Karie Kirks 10/28/19 1741    Drenda Freeze, MD 10/28/19 2306

## 2020-03-19 IMAGING — US US PELVIS COMPLETE
1 series · 13 of 25 positions shown · non-contrast
Comparison: None.

CLINICAL DATA: Pelvic pain, history of abortion 06/17/2019

EXAM:
TRANSABDOMINAL ULTRASOUND OF PELVIS
DOPPLER ULTRASOUND OF OVARIES
TECHNIQUE: Transabdominal ultrasound examination of the pelvis was performed
including evaluation of the uterus, ovaries, adnexal regions, and
pelvic cul-de-sac.
Color and duplex Doppler ultrasound was utilized to evaluate blood
flow to the ovaries.

[Series 1: us pelvis complete · 13 of 89 slices shown]
[im 1/89]
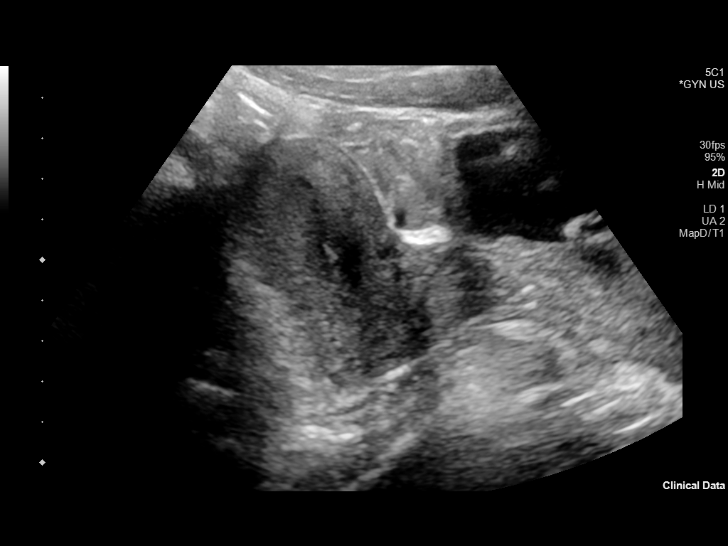
[im 8/89]
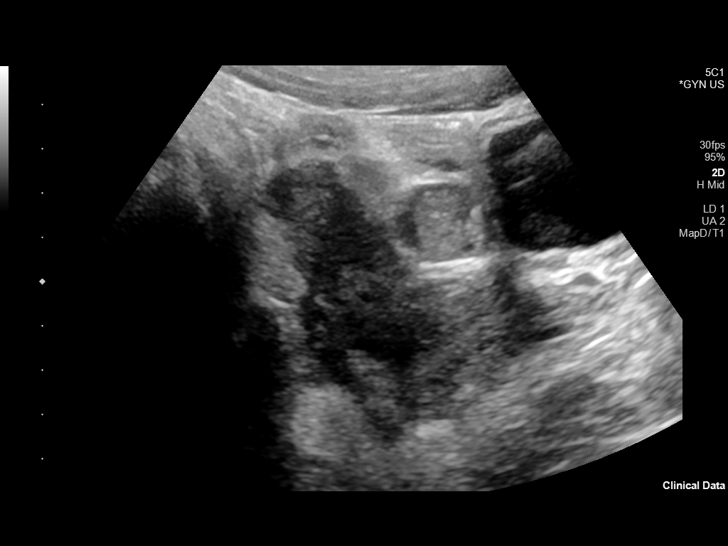
[im 15/89]
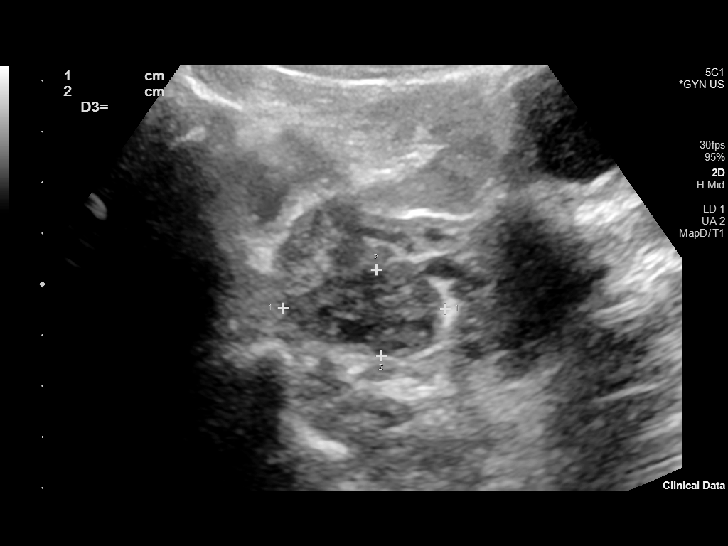
[im 23/89]
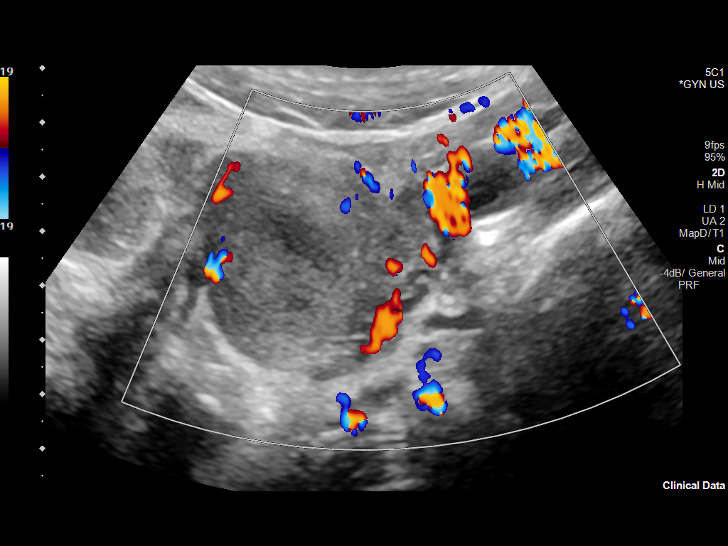
[im 30/89]
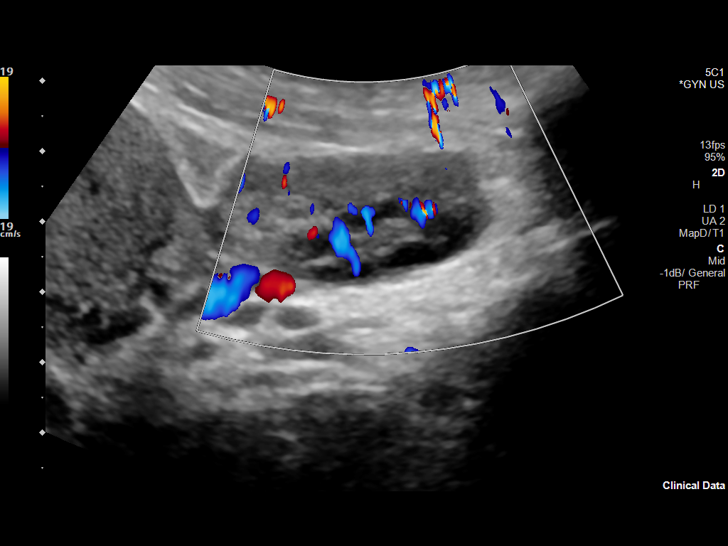
[im 37/89]
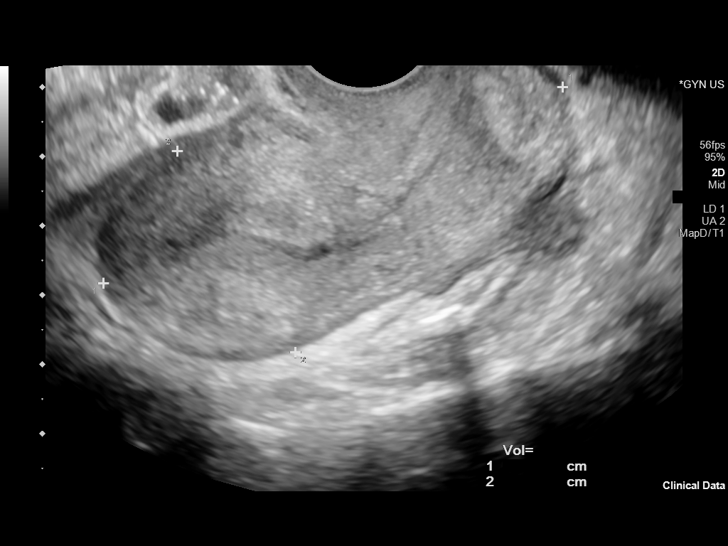
[im 45/89]
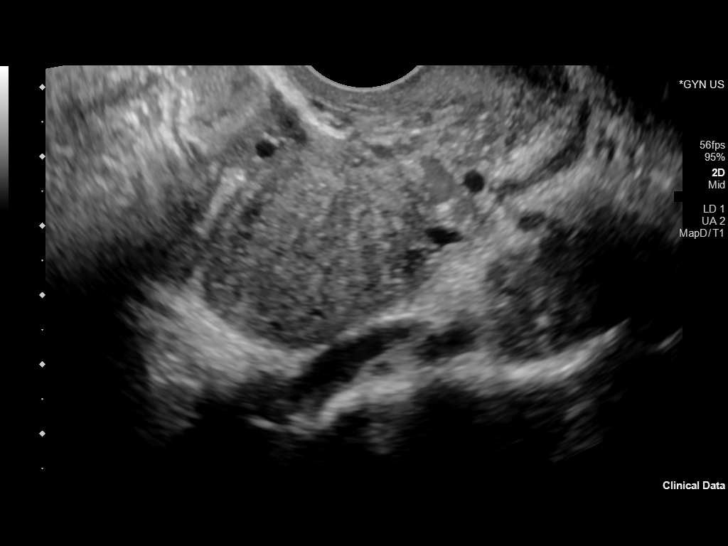
[im 52/89]
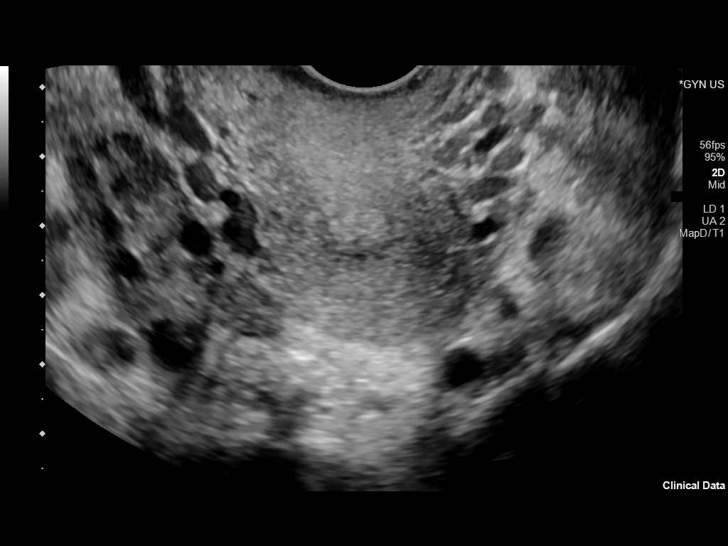
[im 59/89]
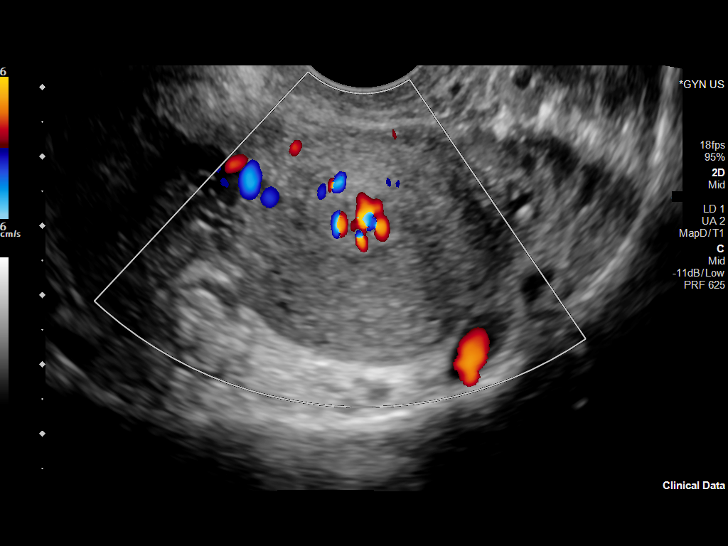
[im 67/89]
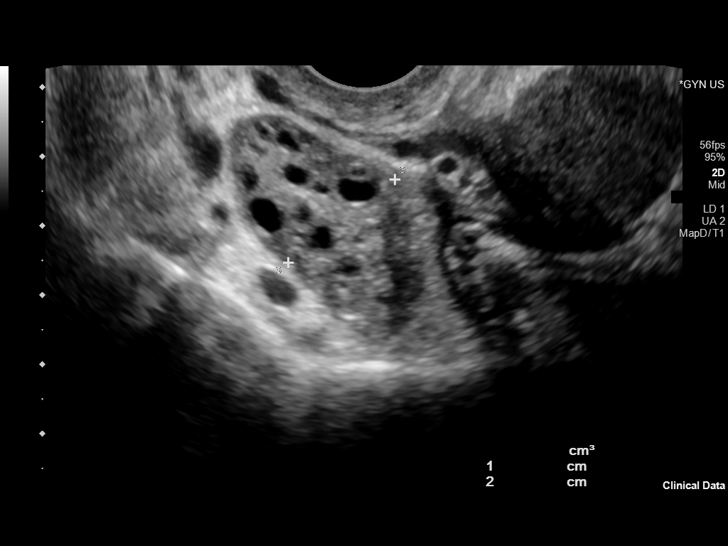
[im 74/89]
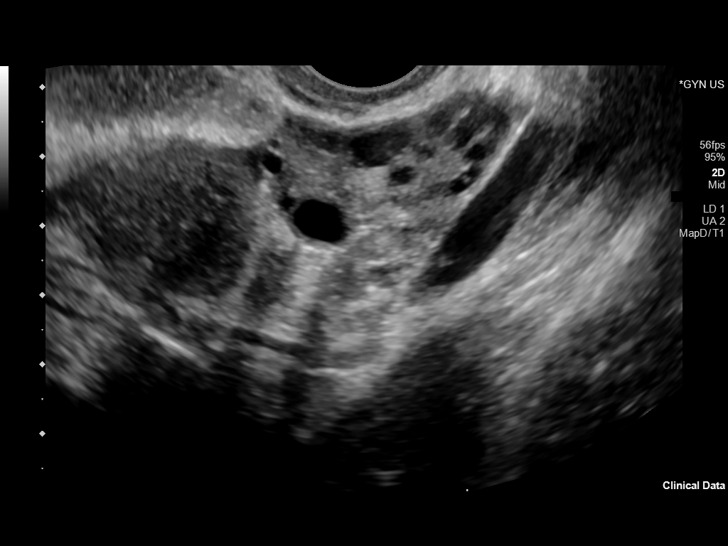
[im 81/89]
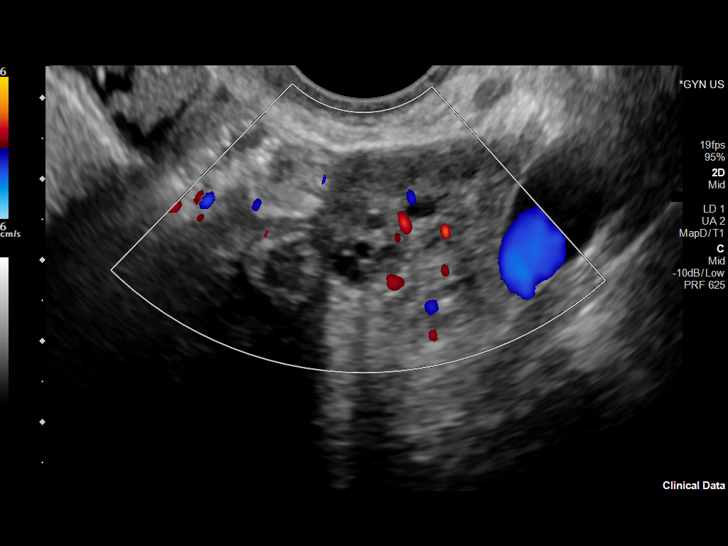
[im 89/89]
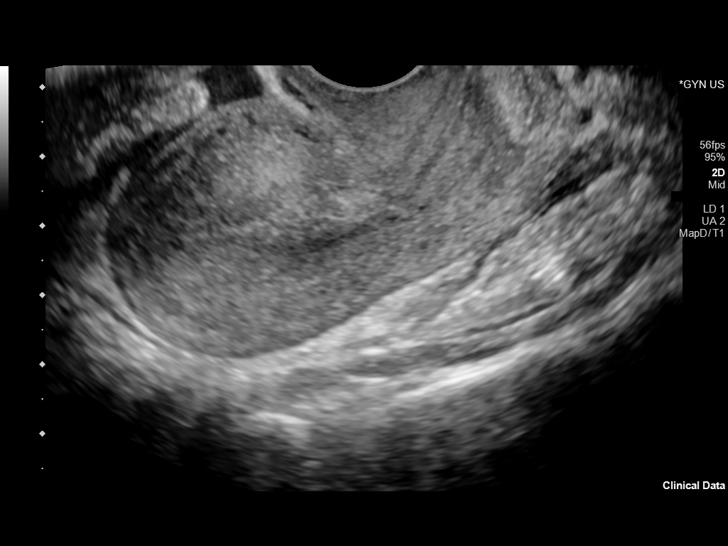

[13 of 25 positions shown; findings below may reference images not displayed]

FINDINGS: Uterus

Measurements: 7.2 x 3.4 x 4.2 cm = volume: 53.5 mL. No fibroids or
other mass visualized.

Endometrium

Thickness: The endometrium measures 4.6 mm. There is slightly
heterogeneous appearance to the endometrium with increased
vascularity. No focal soft tissue mass.

Right ovary

Measurements: 3.8 x 2.1 x 2.0 = volume: 8.1 mL. Normal appearance/no
adnexal mass.

Left ovary

Measurements: 4.0 x 1.6 x 2.6 = volume: 8.7 mL. Normal appearance/no
adnexal mass.

Pulsed Doppler evaluation demonstrates normal low-resistance
arterial and venous waveforms in both ovaries.

Other: Small free fluid within the cul-de-sac.
IMPRESSION: Slightly heterogeneous endometrium with increased vascularity.
However no soft tissue mass or fluid collections within the
endometrial canal.

Small free fluid within the cul-de-sac.

Normal appearing ovaries

## 2020-03-19 IMAGING — US US ART/VEN ABD/PELV/SCROTUM DOPPLER LTD
1 series · 13 of 25 positions shown · non-contrast
Comparison: None.

CLINICAL DATA: Pelvic pain, history of abortion 06/17/2019

EXAM:
TRANSABDOMINAL ULTRASOUND OF PELVIS
DOPPLER ULTRASOUND OF OVARIES
TECHNIQUE: Transabdominal ultrasound examination of the pelvis was performed
including evaluation of the uterus, ovaries, adnexal regions, and
pelvic cul-de-sac.
Color and duplex Doppler ultrasound was utilized to evaluate blood
flow to the ovaries.

[Series 1: us art/ven abd/pelv/scrotum doppler ltd · 13 of 89 slices shown]
[im 1/89]
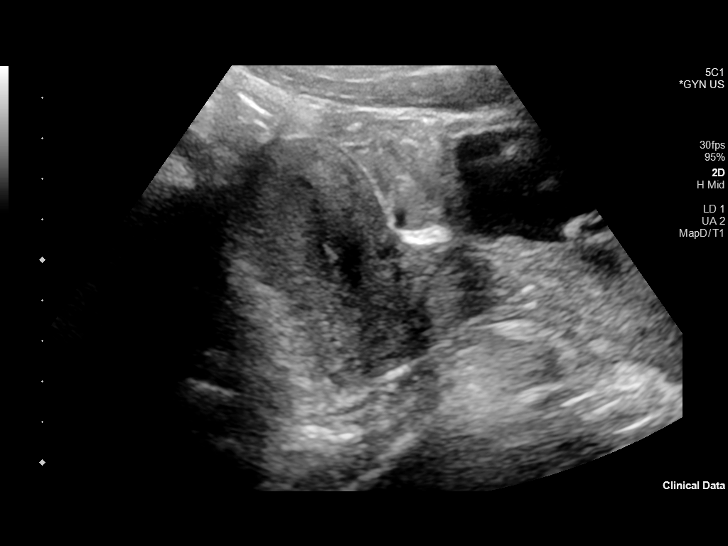
[im 8/89]
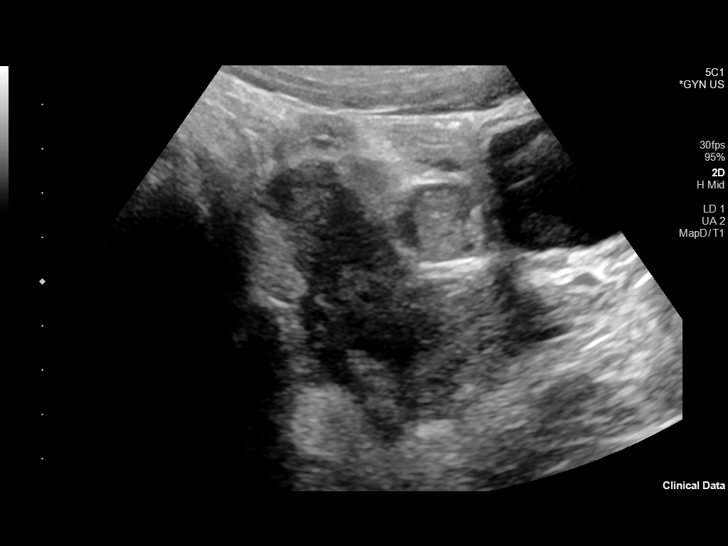
[im 15/89]
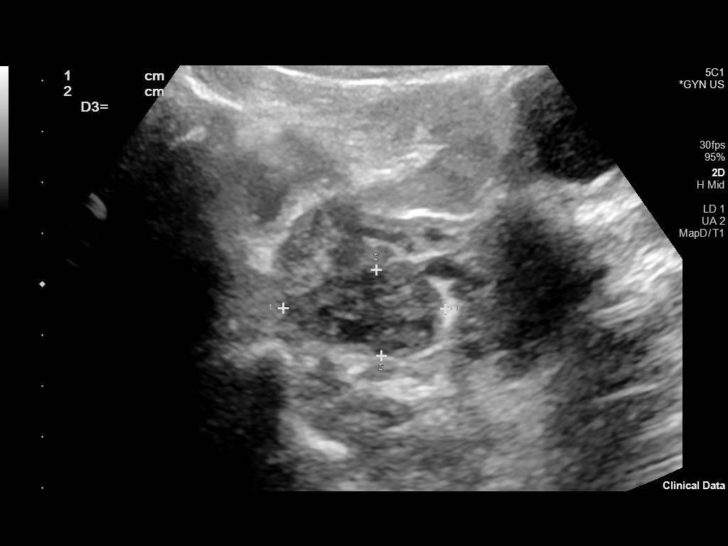
[im 23/89]
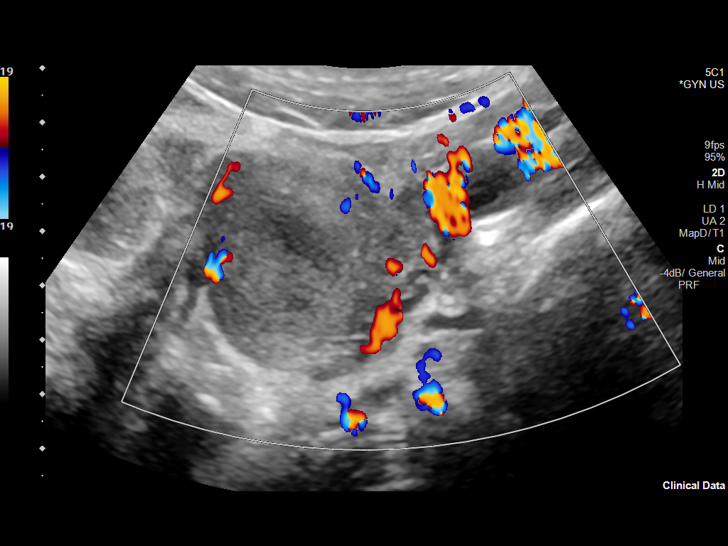
[im 30/89]
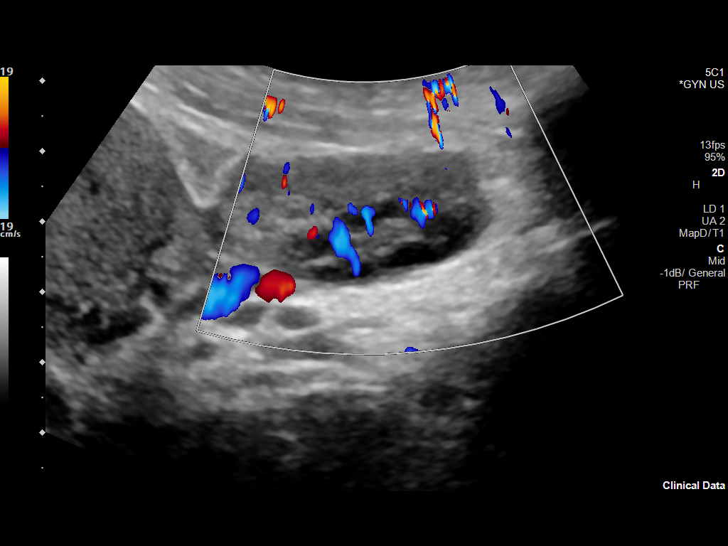
[im 37/89]
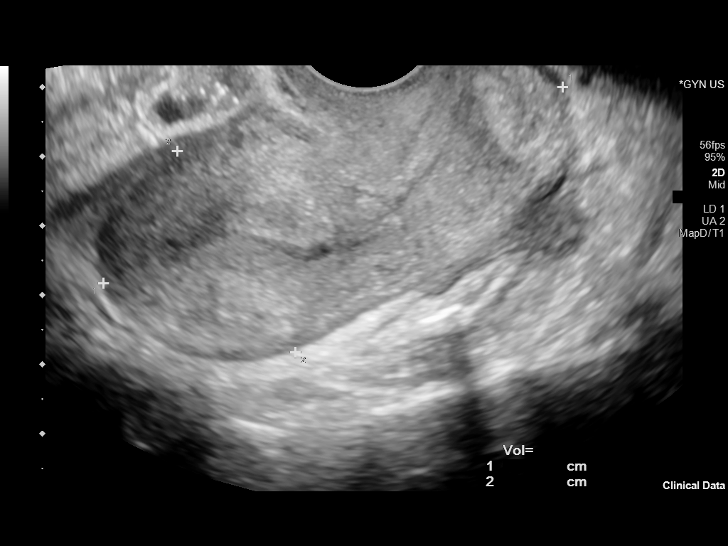
[im 45/89]
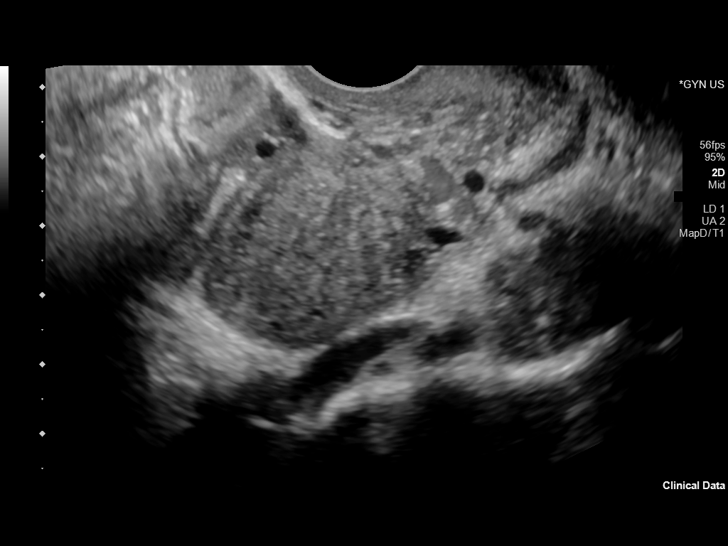
[im 52/89]
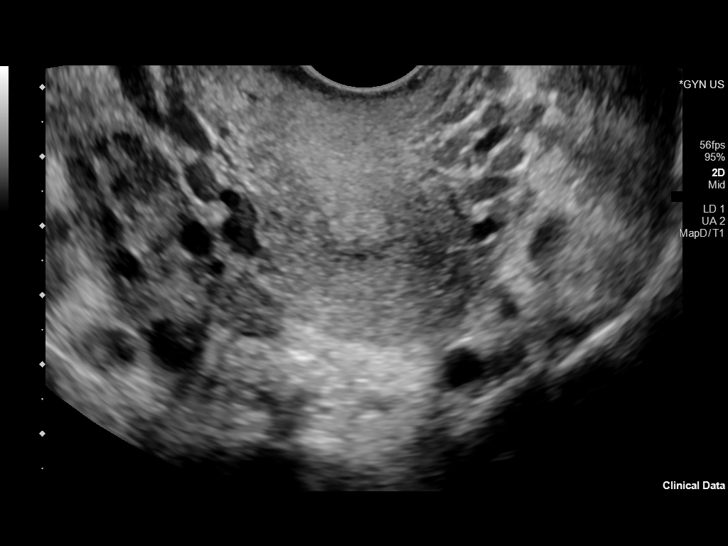
[im 59/89]
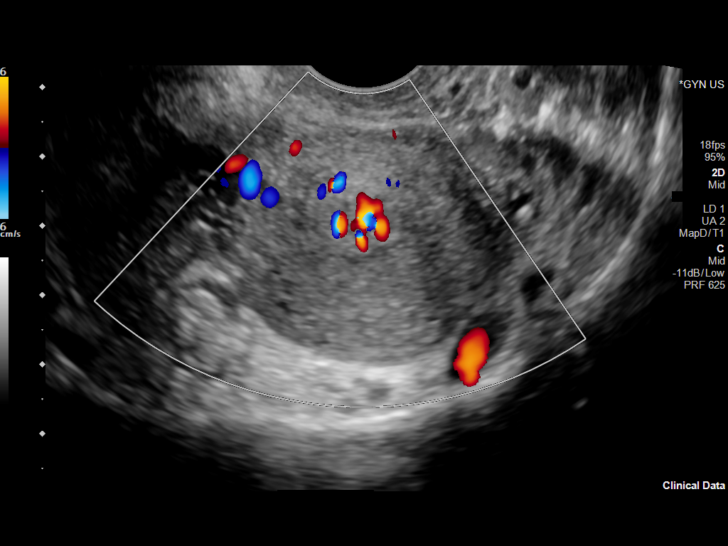
[im 67/89]
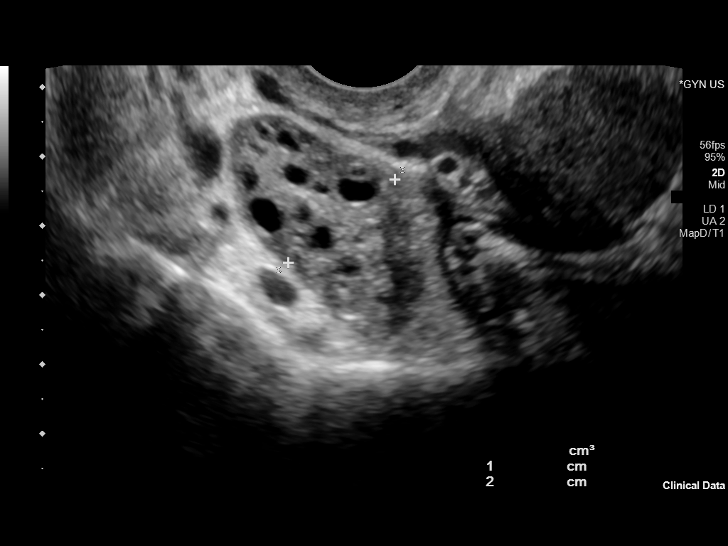
[im 74/89]
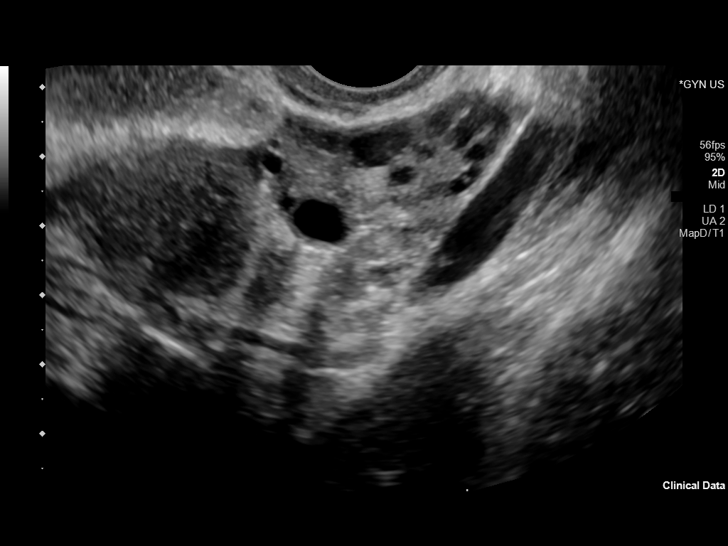
[im 81/89]
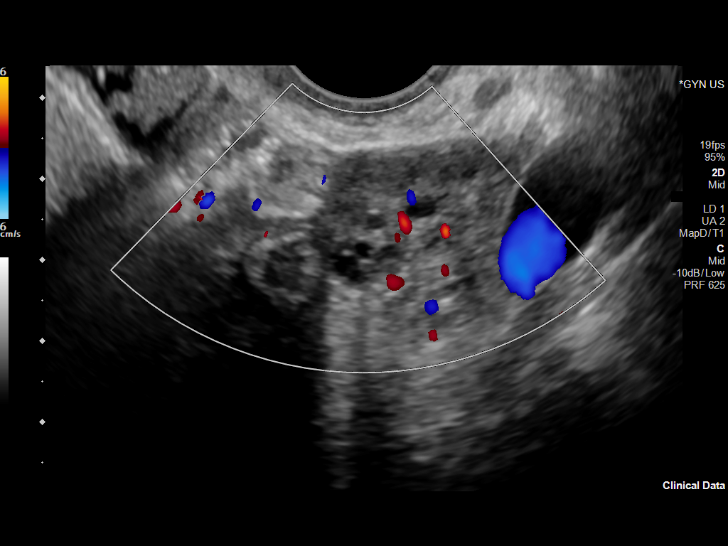
[im 89/89]
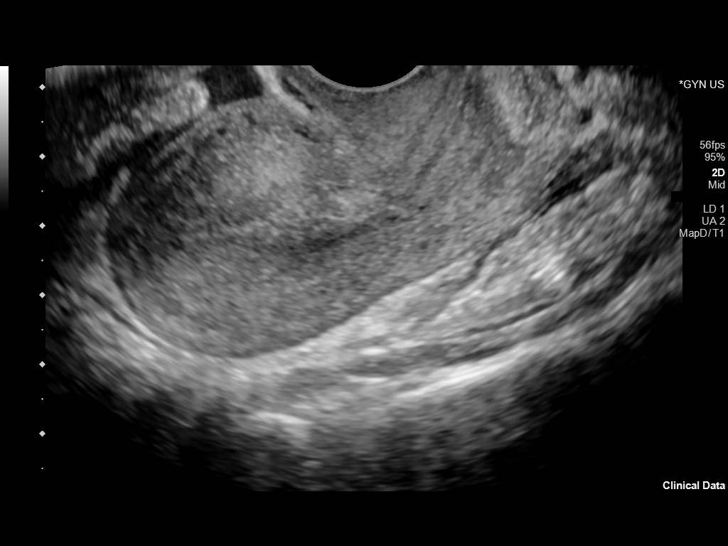

[13 of 25 positions shown; findings below may reference images not displayed]

FINDINGS: Uterus

Measurements: 7.2 x 3.4 x 4.2 cm = volume: 53.5 mL. No fibroids or
other mass visualized.

Endometrium

Thickness: The endometrium measures 4.6 mm. There is slightly
heterogeneous appearance to the endometrium with increased
vascularity. No focal soft tissue mass.

Right ovary

Measurements: 3.8 x 2.1 x 2.0 = volume: 8.1 mL. Normal appearance/no
adnexal mass.

Left ovary

Measurements: 4.0 x 1.6 x 2.6 = volume: 8.7 mL. Normal appearance/no
adnexal mass.

Pulsed Doppler evaluation demonstrates normal low-resistance
arterial and venous waveforms in both ovaries.

Other: Small free fluid within the cul-de-sac.
IMPRESSION: Slightly heterogeneous endometrium with increased vascularity.
However no soft tissue mass or fluid collections within the
endometrial canal.

Small free fluid within the cul-de-sac.

Normal appearing ovaries

## 2020-11-28 ENCOUNTER — Emergency Department (HOSPITAL_COMMUNITY)
Admission: EM | Admit: 2020-11-28 | Discharge: 2020-11-28 | Disposition: A | Discharge status: Home / Self Care | Payer: Medicaid Other | Attending: Emergency Medicine | Admitting: Emergency Medicine

## 2020-11-28 ENCOUNTER — Other Ambulatory Visit: Payer: Self-pay

## 2020-11-28 ENCOUNTER — Emergency Department (HOSPITAL_COMMUNITY)
Admission: EM | Admit: 2020-11-28 | Discharge: 2020-11-29 | Disposition: A | Discharge status: Home / Self Care | Payer: Medicaid Other | Source: Home / Self Care | Attending: Emergency Medicine | Admitting: Emergency Medicine

## 2020-11-28 ENCOUNTER — Encounter (HOSPITAL_COMMUNITY): Payer: Self-pay | Admitting: *Deleted

## 2020-11-28 ENCOUNTER — Encounter (HOSPITAL_COMMUNITY): Payer: Self-pay | Admitting: Emergency Medicine

## 2020-11-28 DIAGNOSIS — J45909 Unspecified asthma, uncomplicated: Secondary | ICD-10-CM | POA: Diagnosis not present

## 2020-11-28 DIAGNOSIS — Z79899 Other long term (current) drug therapy: Secondary | ICD-10-CM | POA: Insufficient documentation

## 2020-11-28 DIAGNOSIS — N12 Tubulo-interstitial nephritis, not specified as acute or chronic: Secondary | ICD-10-CM | POA: Insufficient documentation

## 2020-11-28 DIAGNOSIS — R110 Nausea: Secondary | ICD-10-CM | POA: Insufficient documentation

## 2020-11-28 DIAGNOSIS — R109 Unspecified abdominal pain: Secondary | ICD-10-CM | POA: Insufficient documentation

## 2020-11-28 DIAGNOSIS — Z5321 Procedure and treatment not carried out due to patient leaving prior to being seen by health care provider: Secondary | ICD-10-CM | POA: Insufficient documentation

## 2020-11-28 HISTORY — DX: Urinary tract infection, site not specified: N39.0

## 2020-11-28 LAB — URINALYSIS, ROUTINE W REFLEX MICROSCOPIC
Bilirubin Urine: NEGATIVE
Glucose, UA: NEGATIVE mg/dL
Hgb urine dipstick: NEGATIVE
Ketones, ur: NEGATIVE mg/dL
Nitrite: NEGATIVE
Protein, ur: 30 mg/dL — AB
Specific Gravity, Urine: 1.016 (ref 1.005–1.030)
WBC, UA: >50 WBC/hpf — ABNORMAL HIGH (ref 0–5)
pH: 9 — ABNORMAL HIGH (ref 5.0–8.0)

## 2020-11-28 LAB — CBC WITH DIFFERENTIAL/PLATELET
Abs Immature Granulocytes: 0.03 10*3/uL (ref 0.00–0.07)
Basophils Absolute: 0.1 10*3/uL (ref 0.0–0.1)
Basophils Relative: 1 %
Eosinophils Absolute: 0.1 10*3/uL (ref 0.0–0.5)
Eosinophils Relative: 1 %
HCT: 38.5 % (ref 36.0–46.0)
Hemoglobin: 12.2 g/dL (ref 12.0–15.0)
Immature Granulocytes: 0 %
Lymphocytes Relative: 20 %
Lymphs Abs: 1.9 10*3/uL (ref 0.7–4.0)
MCH: 27.5 pg (ref 26.0–34.0)
MCHC: 31.7 g/dL (ref 30.0–36.0)
MCV: 86.9 fL (ref 80.0–100.0)
Monocytes Absolute: 0.6 10*3/uL (ref 0.1–1.0)
Monocytes Relative: 6 %
Neutro Abs: 6.8 10*3/uL (ref 1.7–7.7)
Neutrophils Relative %: 72 %
Platelets: 302 10*3/uL (ref 150–400)
RBC: 4.43 MIL/uL (ref 3.87–5.11)
RDW: 14.8 % (ref 11.5–15.5)
WBC: 9.5 10*3/uL (ref 4.0–10.5)
nRBC: 0 % (ref 0.0–0.2)

## 2020-11-28 LAB — I-STAT BETA HCG BLOOD, ED (MC, WL, AP ONLY): I-stat hCG, quantitative: <5 m[IU]/mL (ref <5)

## 2020-11-28 LAB — LIPASE, BLOOD: Lipase: 23 U/L (ref 11–51)

## 2020-11-28 LAB — COMPREHENSIVE METABOLIC PANEL
ALT: 14 U/L (ref 0–44)
AST: 19 U/L (ref 15–41)
Albumin: 3.9 g/dL (ref 3.5–5.0)
Alkaline Phosphatase: 59 U/L (ref 38–126)
Anion gap: 8 (ref 5–15)
BUN: 7 mg/dL (ref 6–20)
CO2: 26 mmol/L (ref 22–32)
Calcium: 9.3 mg/dL (ref 8.9–10.3)
Chloride: 104 mmol/L (ref 98–111)
Creatinine, Ser: 0.69 mg/dL (ref 0.44–1.00)
GFR, Estimated: >60 mL/min (ref >60)
Glucose, Bld: 97 mg/dL (ref 70–99)
Potassium: 3.8 mmol/L (ref 3.5–5.1)
Sodium: 138 mmol/L (ref 135–145)
Total Bilirubin: 0.8 mg/dL (ref 0.3–1.2)
Total Protein: 6.9 g/dL (ref 6.5–8.1)

## 2020-11-28 NOTE — ED Triage Notes (Signed)
Patient reports left flank pain onset yesterday , denies injury , mild nausea .

## 2020-11-28 NOTE — ED Triage Notes (Signed)
Pt complains of left flank pain and nausea x 2 days. No diarrhea/vomiting.

## 2020-11-28 NOTE — ED Provider Notes (Signed)
Emergency Medicine Provider Triage Evaluation Note  Kara Byrd , a 20 y.o. female  was evaluated in triage.  Pt complains of left flank pain associated with urinary frequency. Admits to subjective fever. No hematuria. Denies vaginal symptoms. No nausea or vomiting. Previous pyelonephritis which she notes feels similar. No history of kidney stones. No injury.   Review of Systems  Positive: Urinary frequency, flank pain Negative:   Physical Exam  There were no vitals taken for this visit. Gen:   Awake, no distress   Resp:  Normal effort  MSK:   Moves extremities without difficulty  Other:  +left CVA tenderness. Generalized abdominal tenderness with voluntary guarding  Medical Decision Making  Medically screening exam initiated at 8:44 PM.  Appropriate orders placed.  Tressa Busman was informed that the remainder of the evaluation will be completed by another provider, this initial triage assessment does not replace that evaluation, and the importance of remaining in the ED until their evaluation is complete.  Labs UA/culture   Jesusita Oka 11/28/20 2046    Margarita Grizzle, MD 11/28/20 2108

## 2020-11-29 MED ORDER — MORPHINE SULFATE (PF) 4 MG/ML IV SOLN
4.0000 mg | Freq: Once | INTRAVENOUS | Status: AC
  Administered 2020-11-29: 4 mg via INTRAVENOUS
  Filled 2020-11-29: qty 1

## 2020-11-29 MED ORDER — SODIUM CHLORIDE 0.9 % IV SOLN
1.0000 g | Freq: Once | INTRAVENOUS | Status: AC
  Administered 2020-11-29: 1 g via INTRAVENOUS
  Filled 2020-11-29: qty 10

## 2020-11-29 MED ORDER — CEPHALEXIN 500 MG PO CAPS: 500.0000 mg | ORAL_CAPSULE | Freq: Four times a day (QID) | ORAL | 0 refills | Status: AC

## 2020-11-29 MED ORDER — ONDANSETRON HCL 4 MG PO TABS: 4.0000 mg | ORAL_TABLET | Freq: Three times a day (TID) | ORAL | 0 refills | Status: AC | PRN

## 2020-11-29 MED ORDER — ONDANSETRON HCL 4 MG/2ML IJ SOLN
4.0000 mg | Freq: Once | INTRAMUSCULAR | Status: AC
  Administered 2020-11-29: 4 mg via INTRAVENOUS
  Filled 2020-11-29: qty 2

## 2020-11-29 NOTE — ED Provider Notes (Signed)
[facility] EMERGENCY DEPARTMENT Provider Note   CSN: [id number] Arrival date & time: 11/28/20  1936     History Chief Complaint  Patient presents with   L- Flank Pain     Kara Byrd is a 20 y.o. female presents to the Emergency Department complaining of gradual, persistent, progressively worsening urinary frequency and urgency onset approximately 3 days ago.  Patient reports she thought she might have a urinary tract infection.  Reports she also has lower abdominal pain and bilateral low back pain.  Denies fevers or chills.  Has had some associated nausea but without vomiting.  No treatments prior to arrival.  No specific aggravating or alleviating factors. Denies vaginal symptoms including vaginal discharge.  The history is provided by the patient and medical records. No language interpreter was used.      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     Family History  Problem Relation Age of Onset   Asthma Mother     Social History   Tobacco Use   Smoking status: Never   Smokeless tobacco: Never  Substance Use Topics   Alcohol use: No   Drug use: No    Home Medications Prior to Admission medications   Medication Sig Start Date End Date Taking? Authorizing Provider  cephALEXin (KEFLEX) 500 MG capsule Take 1 capsule (500 mg total) by mouth 4 (four) times daily. 11/29/20  Yes Mohid Furuya, Dahlia Client, PA-C  ondansetron (ZOFRAN) 4 MG tablet Take 1 tablet (4 mg total) by mouth every 8 (eight)  hours as needed for nausea or vomiting. 11/29/20  Yes Jarome Trull, Dahlia Client, PA-C  albuterol (PROVENTIL HFA;VENTOLIN HFA) 108 (90 Base) MCG/ACT inhaler Inhale 2 puffs into the lungs every 6 (six) hours as needed for shortness of breath. 12/20/15   [provider]  cetirizine (ZYRTEC) 10 MG tablet Take 10 mg by mouth as needed for allergies.    [provider]  fluticasone (FLONASE SENSIMIST) 27.5 MCG/SPRAY nasal spray Place 2 sprays into the nose daily. 08/24/19   Wieters, Hallie C, PA-C  ipratropium-albuterol (DUONEB) 0.5-2.5 (3) MG/3ML SOLN Inhale 3 mLs into the lungs 4 (four) times daily as needed (sob).  07/14/19   [provider]  lamoTRIgine (LAMICTAL) 150 MG tablet Take 150 mg by mouth daily.    [provider]  levocetirizine (XYZAL) 5 MG tablet Take 1 tablet (5 mg total) by mouth every evening. 08/24/19   Wieters, Hallie C, PA-C  montelukast (SINGULAIR) 5 MG chewable tablet Chew 1 tablet (5 mg total) by mouth at bedtime. 08/24/19   Wieters, Hallie C, PA-C  olopatadine (PATANOL) 0.1 % ophthalmic solution Place 1 drop into both eyes 2 (two) times daily. 08/24/19   Wieters, Hallie C, PA-C    Allergies    Amoxapine and related, Amoxicillin, and Aspirin  Review of Systems   Review of Systems  Constitutional:  Negative for appetite change, diaphoresis, fatigue, fever and unexpected weight change.  HENT:  Negative for mouth sores.   Eyes:  Negative for visual disturbance.  Respiratory:  Negative for cough, chest tightness, shortness of breath and wheezing.   Cardiovascular:  Negative for chest pain.  Gastrointestinal:  Positive for abdominal pain and nausea. Negative for constipation, diarrhea and vomiting.  Endocrine: Negative for polydipsia, polyphagia and polyuria.  Genitourinary:  Positive for flank pain, frequency and urgency. Negative for dysuria and hematuria.  Musculoskeletal:  Negative for back pain and neck stiffness.  Skin:  Negative for rash.   Allergic/Immunologic: Negative for immunocompromised state.  Neurological:  Negative for syncope, light-headedness and headaches.  Hematological:  Does not bruise/bleed easily.  Psychiatric/Behavioral:  Negative for sleep disturbance. The patient is not nervous/anxious.    Physical Exam Updated Vital Signs BP 119/83 (BP Location: Right Arm)   Pulse 73   Temp 98.4 F (36.9 C) (Oral)   Resp 17   LMP 11/11/2020   SpO2 100%   Physical Exam Vitals and nursing note reviewed.  Constitutional:      General: She is not in acute distress.    Appearance: She is not diaphoretic.  HENT:     Head: Normocephalic.  Eyes:     General: No scleral icterus.    Conjunctiva/sclera: Conjunctivae normal.  Cardiovascular:     Rate and Rhythm: Normal rate and regular rhythm.     Pulses: Normal pulses.          Radial pulses are 2+ on the right side and 2+ on the left side.  Pulmonary:     Effort: No tachypnea, accessory muscle usage, prolonged expiration, respiratory distress or retractions.     Breath sounds: No stridor.     Comments: Equal chest rise. No increased work of breathing. Abdominal:     General: There is no distension.     Palpations: Abdomen is soft.     Tenderness: There is abdominal tenderness. There is right CVA tenderness and left CVA tenderness. There is no guarding or rebound.  Musculoskeletal:     Cervical back: Normal range of motion.     Comments: Moves all extremities equally and without difficulty.  Skin:    General: Skin is warm and dry.     Capillary Refill: Capillary refill takes less than 2 seconds.  Neurological:     Mental Status: She is alert.     GCS: GCS eye subscore is 4. GCS verbal subscore is 5. GCS motor subscore is 6.     Comments: Speech is clear and goal oriented.  Psychiatric:        Mood and Affect: Mood normal.    ED Results / Procedures / Treatments   Labs (all labs ordered are listed, but only abnormal results are displayed) Labs Reviewed   URINALYSIS, ROUTINE W REFLEX MICROSCOPIC - Abnormal; Notable for the following components:      Result Value   APPearance CLOUDY (*)    pH 9.0 (*)    Protein, ur 30 (*)    Leukocytes,Ua MODERATE (*)    WBC, UA >50 (*)    Bacteria, UA FEW (*)    All other components within normal limits  URINE CULTURE  CBC WITH DIFFERENTIAL/PLATELET  COMPREHENSIVE METABOLIC PANEL  LIPASE, BLOOD  I-STAT BETA HCG BLOOD, ED ([facility], [facility], [facility] ONLY)     Procedures Procedures   Angiocath insertion Performed by: Dierdre Forth  Consent: Verbal consent obtained. Risks and benefits: risks, benefits and alternatives were discussed Time out: Immediately prior to procedure a "time out"  was called to verify the correct patient, procedure, equipment, support staff and site/side marked as required.  Preparation: Patient was prepped and draped in the usual sterile fashion.  Vein Location: R AC  Not Ultrasound Guided  Gauge: 20  Normal blood return and flush without difficulty Patient tolerance: Patient tolerated the procedure well with no immediate complications.    Medications Ordered in ED Medications  morphine 4 MG/ML injection 4 mg (4 mg Intravenous Given 11/29/20 0519)  ondansetron (ZOFRAN) injection 4 mg (4 mg Intravenous Given 11/29/20 0518)  cefTRIAXone (ROCEPHIN) 1 g in sodium chloride 0.9 % 100 mL IVPB (0 g Intravenous Stopped 11/29/20 0608)    ED Course  I have reviewed the triage vital signs and the nursing notes.  Pertinent labs & imaging results that were available during my care of the patient were reviewed by me and considered in my medical decision making (see chart for details).    MDM Rules/Calculators/A&P                          Patient presents with flank pain, urinary symptoms and nausea.  Afebrile without tachycardia.  Labs are reassuring.  Urinalysis with obvious infection.  Urine culture pending.  No previous urine culture for comparison.  No evidence of sepsis.  Patient  tolerating p.o.  Will give pain control, Rocephin and discharge home.  5:12 AM Pt with improved pain.  Rocephin given.  Ambulates here in the emergency department without difficulty.  Will give Keflex.  Discussed reasons to return immediately to the emergency department.  Patient states understanding and is in agreement with the plan.   Final Clinical Impression(s) / ED Diagnoses Final diagnoses:  Pyelonephritis    Rx / DC Orders ED Discharge Orders          Ordered    cephALEXin (KEFLEX) 500 MG capsule  4 times daily        11/29/20 0510    ondansetron (ZOFRAN) 4 MG tablet  Every 8 hours PRN        11/29/20 0511             Darell Saputo, Dahlia Client, PA-C 11/29/20 7322    Mesner, Barbara Cower, MD 11/29/20 2313

## 2020-11-29 NOTE — Discharge Instructions (Addendum)
1. Medications: Keflex - take as prescribed; zofran - as needed for nausea, usual home medications 2. Treatment: rest, drink plenty of fluids, take medications as prescribed 3. Follow Up: Please followup with your primary doctor in 2-3 days for discussion of your diagnoses and further evaluation after today's visit; if you do not have a primary care doctor use the resource guide provided to find one; return to the ER for fevers, persistent vomiting, worsening abdominal pain or other concerning symptoms.

## 2020-12-01 LAB — URINE CULTURE: Culture: >=100000 — AB

## 2020-12-02 ENCOUNTER — Telehealth: Payer: Self-pay | Admitting: Emergency Medicine

## 2020-12-02 NOTE — Progress Notes (Signed)
ED Antimicrobial Stewardship Positive Culture Follow Up   Kara Byrd is an 20 y.o. female who presented to [facility] on 11/28/2020 with a chief complaint of  Chief Complaint  Patient presents with   L- Flank Pain     Recent Results (from the past 720 hour(s))  Urine Culture     Status: Abnormal   Collection Time: 11/28/20  8:50 PM   Specimen: Urine, Clean Catch  Result Value Ref Range Status   Specimen Description URINE, CLEAN CATCH  Final   Special Requests   Final    NONE Performed at [facility], [address], [place], [place] [address]    Culture >=100,000 COLONIES/mL STAPHYLOCOCCUS SAPROPHYTICUS (A)  Final   Report Status 12/01/2020 FINAL  Final   Organism ID, Bacteria STAPHYLOCOCCUS SAPROPHYTICUS (A)  Final      Susceptibility   Staphylococcus saprophyticus - MIC*    CIPROFLOXACIN <=0.5 SENSITIVE Sensitive     GENTAMICIN <=0.5 SENSITIVE Sensitive     NITROFURANTOIN <=16 SENSITIVE Sensitive     OXACILLIN 0.5 RESISTANT Resistant     TETRACYCLINE <=1 SENSITIVE Sensitive     VANCOMYCIN 1 SENSITIVE Sensitive     TRIMETH/SULFA <=10 SENSITIVE Sensitive     CLINDAMYCIN <=0.25 SENSITIVE Sensitive     RIFAMPIN <=0.5 SENSITIVE Sensitive     Inducible Clindamycin NEGATIVE Sensitive     * >=100,000 COLONIES/mL STAPHYLOCOCCUS SAPROPHYTICUS    [x]  Treated with cephalexin, organism resistant to prescribed antimicrobial []  Patient discharged originally without antimicrobial agent and treatment is now indicated  New antibiotic prescription: Bactrim 1 DS BID x 14 days (Qty 28; Refills 0)  ED Provider: MD , PharmD, BCPS 12/02/2020 10:42 AM ED Clinical Pharmacist -  [phone]

## 2020-12-02 NOTE — Telephone Encounter (Signed)
Post ED Visit - Positive Culture Follow-up: Successful Patient Follow-Up  Culture assessed and recommendations reviewed by:  []  , Pharm.D. []  Enzo Bi, Pharm.D., BCPS AQ-ID []  , Pharm.D., BCPS []  Celedonio Miyamoto, Pharm.D., BCPS []  [place], Garvin Fila.D., BCPS, AAHIVP []  , Pharm.D., BCPS, AAHIVP []  Georgina Pillion, PharmD, BCPS []  , PharmD, BCPS []  [place], PharmD, BCPS [x]  [address], PharmD  Positive urine culture  []  Patient discharged without antimicrobial prescription and treatment is now indicated [x]  Organism is resistant to prescribed ED discharge antimicrobial []  Patient with positive blood cultures  Changes discussed with ED provider: , MD New antibiotic prescription Bactrim DS PO BID for 14 days Called to [facility] [phone])  Contacted patient, date 12/02/2020, time 1245   12/02/2020, 2:35 PM
